# Patient Record
Sex: Female | Born: 1935 | Race: White | Hispanic: No | Marital: Married | Smoking: Former smoker
Health system: Southern US, Community
[De-identification: ages and names within clinical notes are randomized; demographics above are authoritative.]

## PROBLEM LIST (undated history)

## (undated) DIAGNOSIS — I340 Nonrheumatic mitral (valve) insufficiency: Secondary | ICD-10-CM

## (undated) DIAGNOSIS — I350 Nonrheumatic aortic (valve) stenosis: Secondary | ICD-10-CM

## (undated) DIAGNOSIS — N6019 Diffuse cystic mastopathy of unspecified breast: Secondary | ICD-10-CM

## (undated) DIAGNOSIS — I6529 Occlusion and stenosis of unspecified carotid artery: Secondary | ICD-10-CM

## (undated) DIAGNOSIS — I447 Left bundle-branch block, unspecified: Secondary | ICD-10-CM

## (undated) DIAGNOSIS — I495 Sick sinus syndrome: Secondary | ICD-10-CM

## (undated) DIAGNOSIS — I10 Essential (primary) hypertension: Secondary | ICD-10-CM

## (undated) DIAGNOSIS — I2510 Atherosclerotic heart disease of native coronary artery without angina pectoris: Secondary | ICD-10-CM

## (undated) DIAGNOSIS — E785 Hyperlipidemia, unspecified: Secondary | ICD-10-CM

## (undated) DIAGNOSIS — I509 Heart failure, unspecified: Secondary | ICD-10-CM

## (undated) DIAGNOSIS — I714 Abdominal aortic aneurysm, without rupture, unspecified: Secondary | ICD-10-CM

## (undated) DIAGNOSIS — I4891 Unspecified atrial fibrillation: Secondary | ICD-10-CM

## (undated) HISTORY — DX: Abdominal aortic aneurysm, without rupture, unspecified: I71.40

## (undated) HISTORY — DX: Nonrheumatic mitral (valve) insufficiency: I34.0

## (undated) HISTORY — PX: APPENDECTOMY: SHX54

## (undated) HISTORY — DX: Essential (primary) hypertension: I10

## (undated) HISTORY — PX: CHOLECYSTECTOMY: SHX55

## (undated) HISTORY — DX: Sick sinus syndrome: I49.5

## (undated) HISTORY — PX: OTHER SURGICAL HISTORY: SHX169

## (undated) HISTORY — PX: HIP FRACTURE SURGERY: SHX118

## (undated) HISTORY — DX: Occlusion and stenosis of unspecified carotid artery: I65.29

## (undated) HISTORY — DX: Abdominal aortic aneurysm, without rupture: I71.4

## (undated) HISTORY — DX: Unspecified atrial fibrillation: I48.91

## (undated) HISTORY — DX: Diffuse cystic mastopathy of unspecified breast: N60.19

## (undated) HISTORY — DX: Nonrheumatic aortic (valve) stenosis: I35.0

## (undated) HISTORY — DX: Hyperlipidemia, unspecified: E78.5

## (undated) HISTORY — DX: Heart failure, unspecified: I50.9

## (undated) HISTORY — DX: Atherosclerotic heart disease of native coronary artery without angina pectoris: I25.10

## (undated) HISTORY — PX: BREAST LUMPECTOMY: SHX2

## (undated) HISTORY — DX: Left bundle-branch block, unspecified: I44.7

---

## 1995-03-02 HISTORY — PX: ABDOMINAL HYSTERECTOMY: SHX81

## 1996-03-01 HISTORY — PX: TOTAL VAGINAL HYSTERECTOMY: SHX2548

## 2004-03-03 ENCOUNTER — Ambulatory Visit: Payer: Self-pay | Admitting: General Surgery

## 2004-06-30 ENCOUNTER — Ambulatory Visit: Payer: Self-pay | Admitting: Gastroenterology

## 2004-10-08 ENCOUNTER — Ambulatory Visit: Payer: Self-pay | Admitting: Internal Medicine

## 2005-03-01 HISTORY — PX: EYE SURGERY: SHX253

## 2005-03-08 ENCOUNTER — Ambulatory Visit: Payer: Self-pay | Admitting: General Surgery

## 2005-11-03 ENCOUNTER — Ambulatory Visit: Payer: Self-pay | Admitting: Ophthalmology

## 2005-11-09 ENCOUNTER — Ambulatory Visit: Payer: Self-pay | Admitting: Internal Medicine

## 2006-01-05 ENCOUNTER — Ambulatory Visit: Payer: Self-pay | Admitting: Ophthalmology

## 2006-03-01 HISTORY — PX: CARDIAC CATHETERIZATION: SHX172

## 2006-03-01 HISTORY — PX: INSERT / REPLACE / REMOVE PACEMAKER: SUR710

## 2006-03-01 HISTORY — PX: CORONARY ARTERY BYPASS GRAFT: SHX141

## 2006-04-20 ENCOUNTER — Ambulatory Visit: Payer: Self-pay | Admitting: General Surgery

## 2006-09-14 ENCOUNTER — Ambulatory Visit: Payer: Self-pay | Admitting: Internal Medicine

## 2007-01-17 ENCOUNTER — Ambulatory Visit: Payer: Self-pay | Admitting: Internal Medicine

## 2007-01-21 ENCOUNTER — Other Ambulatory Visit: Payer: Self-pay

## 2007-01-22 ENCOUNTER — Inpatient Hospital Stay: Payer: Self-pay | Admitting: Internal Medicine

## 2007-01-23 ENCOUNTER — Other Ambulatory Visit: Payer: Self-pay

## 2007-01-27 HISTORY — PX: OTHER SURGICAL HISTORY: SHX169

## 2007-04-19 ENCOUNTER — Ambulatory Visit: Payer: Self-pay | Admitting: General Surgery

## 2008-02-06 ENCOUNTER — Ambulatory Visit: Payer: Self-pay | Admitting: Internal Medicine

## 2008-02-19 ENCOUNTER — Ambulatory Visit: Payer: Self-pay | Admitting: Internal Medicine

## 2008-03-15 ENCOUNTER — Ambulatory Visit: Payer: Self-pay | Admitting: General Surgery

## 2008-03-21 ENCOUNTER — Ambulatory Visit: Payer: Self-pay | Admitting: General Surgery

## 2008-04-22 ENCOUNTER — Ambulatory Visit: Payer: Self-pay | Admitting: General Surgery

## 2008-09-03 ENCOUNTER — Ambulatory Visit: Payer: Self-pay | Admitting: General Surgery

## 2008-11-07 ENCOUNTER — Ambulatory Visit: Payer: Self-pay | Admitting: Cardiovascular Disease

## 2009-04-14 ENCOUNTER — Ambulatory Visit: Payer: Self-pay | Admitting: General Surgery

## 2009-08-09 ENCOUNTER — Observation Stay: Payer: Self-pay | Admitting: Specialist

## 2009-08-12 ENCOUNTER — Inpatient Hospital Stay: Payer: Self-pay | Admitting: Specialist

## 2009-10-23 ENCOUNTER — Ambulatory Visit: Payer: Self-pay | Admitting: Gastroenterology

## 2009-10-29 LAB — PATHOLOGY REPORT

## 2010-02-11 ENCOUNTER — Ambulatory Visit: Payer: Self-pay | Admitting: Gastroenterology

## 2010-04-06 ENCOUNTER — Ambulatory Visit: Payer: Self-pay | Admitting: General Surgery

## 2010-04-15 ENCOUNTER — Ambulatory Visit: Payer: Self-pay | Admitting: General Surgery

## 2010-08-14 ENCOUNTER — Emergency Department: Payer: Self-pay | Admitting: Emergency Medicine

## 2011-04-12 ENCOUNTER — Ambulatory Visit: Payer: Self-pay | Admitting: General Surgery

## 2011-04-20 ENCOUNTER — Ambulatory Visit: Payer: Self-pay | Admitting: General Surgery

## 2011-07-25 ENCOUNTER — Observation Stay: Payer: Self-pay | Admitting: Internal Medicine

## 2011-07-25 LAB — COMPREHENSIVE METABOLIC PANEL
Albumin: 3.6 g/dL (ref 3.4–5.0)
Alkaline Phosphatase: 66 U/L (ref 50–136)
Anion Gap: 9 (ref 7–16)
BUN: 31 mg/dL — ABNORMAL HIGH (ref 7–18)
Bilirubin,Total: 0.6 mg/dL (ref 0.2–1.0)
Calcium, Total: 8.4 mg/dL — ABNORMAL LOW (ref 8.5–10.1)
Chloride: 109 mmol/L — ABNORMAL HIGH (ref 98–107)
EGFR (African American): 36 — ABNORMAL LOW
EGFR (Non-African Amer.): 31 — ABNORMAL LOW
Osmolality: 284 (ref 275–301)
Potassium: 4 mmol/L (ref 3.5–5.1)
SGPT (ALT): 31 U/L
Sodium: 139 mmol/L (ref 136–145)
Total Protein: 7.7 g/dL (ref 6.4–8.2)

## 2011-07-25 LAB — URINALYSIS, COMPLETE
Bacteria: NONE SEEN
Bilirubin,UR: NEGATIVE
Hyaline Cast: 14
Nitrite: NEGATIVE
RBC,UR: 3 /HPF (ref 0–5)

## 2011-07-25 LAB — PROTIME-INR
INR: 1.9
Prothrombin Time: 22.3 secs — ABNORMAL HIGH (ref 11.5–14.7)

## 2011-07-25 LAB — CBC
HCT: 40.7 % (ref 35.0–47.0)
HGB: 13.6 g/dL (ref 12.0–16.0)
MCH: 31.7 pg (ref 26.0–34.0)
MCHC: 33.4 g/dL (ref 32.0–36.0)
RBC: 4.28 10*6/uL (ref 3.80–5.20)

## 2011-07-25 LAB — CK TOTAL AND CKMB (NOT AT ARMC)
CK, Total: 48 U/L (ref 21–215)
CK-MB: 1.5 ng/mL (ref 0.5–3.6)

## 2011-07-26 LAB — CBC WITH DIFFERENTIAL/PLATELET
Eosinophil #: 0.2 10*3/uL (ref 0.0–0.7)
Eosinophil %: 3.6 %
HCT: 35.5 % (ref 35.0–47.0)
HGB: 11.7 g/dL — ABNORMAL LOW (ref 12.0–16.0)
Lymphocyte #: 0.9 10*3/uL — ABNORMAL LOW (ref 1.0–3.6)
MCH: 31.5 pg (ref 26.0–34.0)
Monocyte #: 0.7 x10 3/mm (ref 0.2–0.9)
Monocyte %: 16.1 %
Neutrophil #: 2.5 10*3/uL (ref 1.4–6.5)
WBC: 4.2 10*3/uL (ref 3.6–11.0)

## 2011-07-26 LAB — BASIC METABOLIC PANEL
Anion Gap: 9 (ref 7–16)
BUN: 23 mg/dL — ABNORMAL HIGH (ref 7–18)
Calcium, Total: 8.3 mg/dL — ABNORMAL LOW (ref 8.5–10.1)
Chloride: 107 mmol/L (ref 98–107)
Co2: 22 mmol/L (ref 21–32)
Creatinine: 1.15 mg/dL (ref 0.60–1.30)
EGFR (Non-African Amer.): 45 — ABNORMAL LOW
Glucose: 123 mg/dL — ABNORMAL HIGH (ref 65–99)
Osmolality: 281 (ref 275–301)
Potassium: 4 mmol/L (ref 3.5–5.1)
Sodium: 138 mmol/L (ref 136–145)

## 2011-07-26 LAB — CLOSTRIDIUM DIFFICILE BY PCR

## 2011-07-26 LAB — MAGNESIUM: Magnesium: 1.6 mg/dL — ABNORMAL LOW

## 2011-07-26 LAB — OCCULT BLOOD X 1 CARD TO LAB, STOOL: Occult Blood, Feces: POSITIVE

## 2011-07-27 LAB — BASIC METABOLIC PANEL
Anion Gap: 9 (ref 7–16)
BUN: 14 mg/dL (ref 7–18)
Calcium, Total: 8.2 mg/dL — ABNORMAL LOW (ref 8.5–10.1)
Co2: 21 mmol/L (ref 21–32)
Creatinine: 1.12 mg/dL (ref 0.60–1.30)
Sodium: 141 mmol/L (ref 136–145)

## 2011-07-27 LAB — CBC WITH DIFFERENTIAL/PLATELET
Eosinophil #: 0.2 10*3/uL (ref 0.0–0.7)
Eosinophil %: 4 %
Lymphocyte #: 1.3 10*3/uL (ref 1.0–3.6)
Lymphocyte %: 24.5 %
MCH: 31.4 pg (ref 26.0–34.0)
Monocyte #: 0.9 x10 3/mm (ref 0.2–0.9)
Monocyte %: 16.5 %
Neutrophil #: 2.9 10*3/uL (ref 1.4–6.5)
Platelet: 182 10*3/uL (ref 150–440)
RBC: 3.67 10*6/uL — ABNORMAL LOW (ref 3.80–5.20)
RDW: 13.7 % (ref 11.5–14.5)
WBC: 5.3 10*3/uL (ref 3.6–11.0)

## 2011-07-27 LAB — PROTIME-INR
INR: 2.1
Prothrombin Time: 23.4 secs — ABNORMAL HIGH (ref 11.5–14.7)

## 2011-07-28 LAB — STOOL CULTURE

## 2011-08-27 ENCOUNTER — Encounter: Payer: Self-pay | Admitting: Cardiovascular Disease

## 2011-08-31 ENCOUNTER — Encounter: Payer: Self-pay | Admitting: Cardiovascular Disease

## 2011-08-31 ENCOUNTER — Ambulatory Visit (INDEPENDENT_AMBULATORY_CARE_PROVIDER_SITE_OTHER): Payer: Medicare Other | Admitting: Cardiovascular Disease

## 2011-08-31 VITALS — BP 110/58 | HR 78 | Ht 64.0 in | Wt 105.0 lb

## 2011-08-31 DIAGNOSIS — E785 Hyperlipidemia, unspecified: Secondary | ICD-10-CM | POA: Insufficient documentation

## 2011-08-31 DIAGNOSIS — I714 Abdominal aortic aneurysm, without rupture: Secondary | ICD-10-CM | POA: Insufficient documentation

## 2011-08-31 DIAGNOSIS — I509 Heart failure, unspecified: Secondary | ICD-10-CM | POA: Insufficient documentation

## 2011-08-31 DIAGNOSIS — I2510 Atherosclerotic heart disease of native coronary artery without angina pectoris: Secondary | ICD-10-CM

## 2011-08-31 DIAGNOSIS — I340 Nonrheumatic mitral (valve) insufficiency: Secondary | ICD-10-CM

## 2011-08-31 DIAGNOSIS — Z950 Presence of cardiac pacemaker: Secondary | ICD-10-CM

## 2011-08-31 DIAGNOSIS — R0600 Dyspnea, unspecified: Secondary | ICD-10-CM

## 2011-08-31 DIAGNOSIS — R0989 Other specified symptoms and signs involving the circulatory and respiratory systems: Secondary | ICD-10-CM | POA: Insufficient documentation

## 2011-08-31 DIAGNOSIS — I4891 Unspecified atrial fibrillation: Secondary | ICD-10-CM | POA: Insufficient documentation

## 2011-08-31 NOTE — Assessment & Plan Note (Signed)
Continue long-term anticoagulation with warfarin with a target INR between 2 and 3. I will enroll her in our anticoagulation clinic. We can consider switching her to one of the newer agents if desired.

## 2011-08-31 NOTE — Patient Instructions (Addendum)
Your physician has requested that you have an echocardiogram. Echocardiography is a painless test that uses sound waves to create images of your heart. It provides your doctor with information about the size and shape of your heart and how well your heart's chambers and valves are working. This procedure takes approximately one hour. There are no restrictions for this procedure.  Your physician has requested that you have a carotid duplex. This test is an ultrasound of the carotid arteries in your neck. It looks at blood flow through these arteries that supply the brain with blood. Allow one hour for this exam. There are no restrictions or special instructions.  Enroll in anticoagulation clinic.   Dr. Graciela Husbands to establish pacemanker care.   Dr. Darrick Huntsman or Dr. Dan Humphreys to establish primary care.   Follow up after tests.

## 2011-08-31 NOTE — Assessment & Plan Note (Signed)
I will obtain carotid  duplex ultrasound.

## 2011-08-31 NOTE — Progress Notes (Signed)
HPI  Robin [name] is a pleasant 78 year old female who is here today to establish cardiovascular care with me. I took care of her in 2008 when she presented with non-ST elevation myocardial infarction, heart failure with severely reduced LV systolic function as well as atrial fibrillation. Cardiac catheterization at that time showed severe three-vessel coronary artery disease. She underwent CABG at [facility] by Dr. Earlene Plater as well as Maze procedure. She had heart block that required dual-chamber pacemaker placement. She has done well since then with medical therapy. Her ejection fraction gradually improved and most recently was 40% in 2011. Patient is requesting to transfer all her care from [facility] to [place].  She has been relatively stable from a cardiac standpoint. However, she has been having recurrent issues with diarrhea and was hospitalized at [facility] in [place] for that reason. Basic workup for C. difficile and stool cultures were unremarkable. The patient's diarrhea is currently better. On average twice a day and the stool is more formed than before. She is using Imodium as needed.  Allergies  Allergen Reactions  . Amoxicillin   . Codeine     Nausea/vomiting/diarrhea     Current Outpatient Prescriptions on File Prior to Visit  Medication Sig Dispense Refill  . atorvastatin (LIPITOR) 40 MG tablet Take 40 mg by mouth daily.      . clonazePAM (KLONOPIN) 0.5 MG tablet Take 0.5 mg by mouth daily.      . Cyanocobalamin (VITAMIN B-12 SL) Place 2 sprays under the tongue 3 (three) times daily.      . digoxin (LANOXIN) 0.125 MG tablet Take 125 mcg by mouth daily.      Marland Kitchen FLUoxetine (PROZAC) 40 MG capsule Take 40 mg by mouth daily.      . metoprolol succinate (TOPROL-XL) 50 MG 24 hr tablet Take 50 mg by mouth daily. Take with or immediately following a meal.      . ramipril (ALTACE) 5 MG tablet Take 5 mg by mouth daily.      Marland Kitchen warfarin (COUMADIN) 1 MG tablet Takes 1/2 tablet daily as directed.          Past Medical History  Diagnosis Date  . Hypertension   . Abdominal aortic aneurysm     4.2 cm followed by Dr. Evette Cristal  . Hyperlipidemia   . CAD (coronary artery disease)     Severe 3 vessel CAD with severe ischemic cardiomyopathy in 12/2006. s/p CABG at [facility] by Dr. Earlene Plater: LIMA to LAD, SVG to OM1, SVG to [facility] and SVG to PDA. cardiac cath 06/11 : patent grafts.   . CHF (congestive heart failure)     w/ejection fraction 10% to 15%  . Atrial fibrillation   . Left bundle branch block   . Sick sinus syndrome     s/p dual chamber pacemaker 01/2007 post CABG and Maze procedure.   . Mitral regurgitation     Moderate to severe by echo but mild by intraoperative TEE in 2008     Past Surgical History  Procedure Date  . Insert / replace / remove pacemaker 2008    @ [facility]  . Bilateral maze procedure   . Coronary bypass graft stenosis 01/27/07    @ [facility];Dr. Earlene Plater  . Total vaginal hysterectomy 1998  . Cholecystectomy   . Appendectomy   . Breast lumpectomy     left  . Cardiac catheterization 2008    LAD 80% MID, LCX 100%MID, RCA 70%  . Coronary artery bypass graft 2008  not a redo candidate due to calcified aorta     History reviewed. No pertinent family history.   History   Social History  . Marital Status: Married    Spouse Name: N/A    Number of Children: N/A  . Years of Education: N/A   Occupational History  . Not on file.   Social History Main Topics  . Smoking status: Never Smoker   . Smokeless tobacco: Not on file  . Alcohol Use: No  . Drug Use: No  . Sexually Active:    Other Topics Concern  . Not on file   Social History Narrative  . No narrative on file     ROS Constitutional: Negative for fever, chills, diaphoresis.  HENT: Negative for hearing loss, nosebleeds, congestion, sore throat, facial swelling, drooling, trouble swallowing, neck pain, voice change, sinus pressure and tinnitus.  Eyes: Negative for photophobia, pain, discharge and visual  disturbance.  Respiratory: Negative for apnea, cough and wheezing.  Cardiovascular: Negative for thyroid some palpitations and leg swelling.  Gastrointestinal: Negative for nausea, vomiting, abdominal pain, constipation, blood in stool and abdominal distention.  Genitourinary: Negative for dysuria, urgency, frequency, hematuria and decreased urine volume.  Musculoskeletal: Negative for myalgias, back pain, joint swelling, arthralgias and gait problem.  Skin: Negative for color change, pallor, rash and wound.  Neurological: Negative for dizziness, tremors, seizures, syncope, speech difficulty, weakness, light-headedness, numbness and headaches.  Psychiatric/Behavioral: Negative for suicidal ideas, hallucinations, behavioral problems and agitation. The patient is not nervous/anxious.     PHYSICAL EXAM   BP 110/58  Pulse 78  Ht 5\' 4"  (1.626 m)  Wt 105 lb (47.628 kg)  BMI 18.02 kg/m2 Constitutional: She is oriented to person, place, and time. She appears well-developed and well-nourished. No distress.  HENT: No nasal discharge.  Head: Normocephalic and atraumatic.  Eyes: Pupils are equal and round. Right eye exhibits no discharge. Left eye exhibits no discharge.  Neck: Normal range of motion. Neck supple. No JVD present. No thyromegaly present. There is right carotid bruit Cardiovascular: Normal rate, regular rhythm, normal heart sounds. Exam reveals no gallop and no friction rub. There is a 3/6 holosystolic murmur at the apex and left sternal border.  Pulmonary/Chest: Effort normal and breath sounds normal. No stridor. No respiratory distress. She has no wheezes. She has no rales. She exhibits no tenderness.  Abdominal: Soft. Bowel sounds are normal. She exhibits no distension. There is no tenderness. There is no rebound and no guarding.  Musculoskeletal: Normal range of motion. She exhibits no edema and no tenderness.  Neurological: She is alert and oriented to person, place, and time.  Coordination normal.  Skin: Skin is warm and dry. No rash noted. She is not diaphoretic. No erythema. No pallor.  Psychiatric: She has a normal mood and affect. Her behavior is normal. Judgment and thought content normal.     EKG: Electronic atrial pacemaker   -Left bundle branch block.    ASSESSMENT AND PLAN

## 2011-08-31 NOTE — Assessment & Plan Note (Signed)
Recheck with a transthoracic echocardiogram.

## 2011-08-31 NOTE — Assessment & Plan Note (Signed)
I will have her establish in our EP clinic for interrogation and followup.

## 2011-08-31 NOTE — Assessment & Plan Note (Signed)
She appears to be euvolemic. Currently [place] Heart Association class II .Continue treatment with Toprol and Ramipril.  Recheck echocardiogram. I will consider stopping  digoxin if ejection fraction is greater than 35%.

## 2011-08-31 NOTE — Assessment & Plan Note (Signed)
Continue treatment with atorvastatin. Target LDL is less than 70. 

## 2011-08-31 NOTE — Assessment & Plan Note (Signed)
She is currently not having any symptoms suggestive of angina. Most recent cardiac catheterization in 2011 showed patent grafts. Continue medical therapy.   

## 2011-09-09 ENCOUNTER — Encounter (INDEPENDENT_AMBULATORY_CARE_PROVIDER_SITE_OTHER): Payer: Medicare Other

## 2011-09-09 DIAGNOSIS — I6529 Occlusion and stenosis of unspecified carotid artery: Secondary | ICD-10-CM

## 2011-09-09 DIAGNOSIS — R0989 Other specified symptoms and signs involving the circulatory and respiratory systems: Secondary | ICD-10-CM

## 2011-09-20 ENCOUNTER — Other Ambulatory Visit (INDEPENDENT_AMBULATORY_CARE_PROVIDER_SITE_OTHER): Payer: Medicare Other

## 2011-09-20 ENCOUNTER — Other Ambulatory Visit: Payer: Self-pay

## 2011-09-20 DIAGNOSIS — R0989 Other specified symptoms and signs involving the circulatory and respiratory systems: Secondary | ICD-10-CM

## 2011-09-20 DIAGNOSIS — R0600 Dyspnea, unspecified: Secondary | ICD-10-CM

## 2011-09-23 ENCOUNTER — Ambulatory Visit (INDEPENDENT_AMBULATORY_CARE_PROVIDER_SITE_OTHER): Payer: Medicare Other | Admitting: Cardiovascular Disease

## 2011-09-23 ENCOUNTER — Encounter: Payer: Self-pay | Admitting: Cardiovascular Disease

## 2011-09-23 VITALS — BP 122/60 | HR 72 | Ht 64.0 in | Wt 165.5 lb

## 2011-09-23 DIAGNOSIS — I509 Heart failure, unspecified: Secondary | ICD-10-CM

## 2011-09-23 DIAGNOSIS — R0989 Other specified symptoms and signs involving the circulatory and respiratory systems: Secondary | ICD-10-CM

## 2011-09-23 DIAGNOSIS — I4891 Unspecified atrial fibrillation: Secondary | ICD-10-CM

## 2011-09-23 DIAGNOSIS — I350 Nonrheumatic aortic (valve) stenosis: Secondary | ICD-10-CM | POA: Insufficient documentation

## 2011-09-23 DIAGNOSIS — I340 Nonrheumatic mitral (valve) insufficiency: Secondary | ICD-10-CM

## 2011-09-23 DIAGNOSIS — I2510 Atherosclerotic heart disease of native coronary artery without angina pectoris: Secondary | ICD-10-CM

## 2011-09-23 DIAGNOSIS — I059 Rheumatic mitral valve disease, unspecified: Secondary | ICD-10-CM

## 2011-09-23 NOTE — Assessment & Plan Note (Signed)
Moderate by echo. She also has aortic valve disease. Repeat echocardiogram in one to 2 years.

## 2011-09-23 NOTE — Progress Notes (Signed)
HPI  [name] is a pleasant 78 year old female who is here today for a followup visit. In 2008  she presented with non-ST elevation myocardial infarction, heart failure with severely reduced LV systolic function (EF10-15%) as well as atrial fibrillation. Cardiac catheterization at that time showed severe three-vessel coronary artery disease. She underwent CABG at [facility] by Dr. Earlene Plater as well as Maze procedure. She had heart block that required dual-chamber pacemaker placement. She has done well since then with medical therapy. Her ejection fraction gradually improved. In 2011 and it was 40%. Echo from this month showed improvement to 50-55%. She has been relatively stable from a cardiac standpoint. However, she has been having recurrent issues with diarrhea and was hospitalized at [facility] in [place] for that reason. Basic workup for C. difficile and stool cultures were unremarkable. The patient's diarrhea is significantly better now.  She was noted to have right carotid bruit. Carotid duplex ultrasound showed moderate ICA stenosis bilaterally.   Allergies  Allergen Reactions  . Amoxicillin   . Codeine     Nausea/vomiting/diarrhea     Current Outpatient Prescriptions on File Prior to Visit  Medication Sig Dispense Refill  . atorvastatin (LIPITOR) 40 MG tablet Take 40 mg by mouth daily.      . clonazePAM (KLONOPIN) 0.5 MG tablet Take 0.5 mg by mouth daily as needed.       . Cyanocobalamin (VITAMIN B-12 SL) Place 2 sprays under the tongue 3 (three) times daily.      Marland Kitchen FLUoxetine (PROZAC) 40 MG capsule Take 40 mg by mouth daily.      . metoprolol succinate (TOPROL-XL) 50 MG 24 hr tablet Take 50 mg by mouth daily. Take with or immediately following a meal.      . Multiple Vitamins-Minerals (CENTRUM SILVER PO) Take by mouth daily.      . Probiotic Product (ALIGN PO) Take by mouth daily.      . ramipril (ALTACE) 5 MG tablet Take 5 mg by mouth daily.      Marland Kitchen warfarin (COUMADIN) 1 MG tablet Takes 1/2  tablet daily as directed.      . warfarin (COUMADIN) 3 MG tablet Take 3 mg by mouth daily. As directed.         Past Medical History  Diagnosis Date  . Hypertension   . Abdominal aortic aneurysm     4.2 cm followed by Dr. Evette Cristal  . Hyperlipidemia   . CAD (coronary artery disease)     Severe 3 vessel CAD with severe ischemic cardiomyopathy in 12/2006. s/p CABG at [facility] by Dr. Earlene Plater: LIMA to LAD, SVG to OM1, SVG to [facility] and SVG to PDA. cardiac cath 06/11 : patent grafts.   . CHF (congestive heart failure)     w/ejection fraction 10% to 15%  . Atrial fibrillation   . Left bundle branch block   . Sick sinus syndrome     s/p dual chamber pacemaker 01/2007 post CABG and Maze procedure.   . Mitral regurgitation     Moderate to severe by echo but mild by intraoperative TEE in 2008  . Aortic stenosis     Mild to moderate with mild to moderate AI.      Past Surgical History  Procedure Date  . Insert / replace / remove pacemaker 2008    @ [facility]  . Bilateral maze procedure   . Coronary bypass graft stenosis 01/27/07    @ [facility];Dr. Earlene Plater  . Total vaginal hysterectomy 1998  .  Cholecystectomy   . Appendectomy   . Breast lumpectomy     left  . Cardiac catheterization 2008    LAD 80% MID, LCX 100%MID, RCA 70%  . Coronary artery bypass graft 2008    not a redo candidate due to calcified aorta     History reviewed. No pertinent family history.   History   Social History  . Marital Status: Married    Spouse Name: N/A    Number of Children: N/A  . Years of Education: N/A   Occupational History  . Not on file.   Social History Main Topics  . Smoking status: Former Smoker -- 0.2 packs/day for 50 years    Types: Cigarettes    Quit date: 08/01/2006  . Smokeless tobacco: Not on file  . Alcohol Use: 0.6 oz/week    1 Glasses of wine per week     occasional  . Drug Use: No  . Sexually Active:    Other Topics Concern  . Not on file   Social History Narrative  . No narrative on  file     PHYSICAL EXAM   BP 122/60  Pulse 72  Ht 5\' 4"  (1.626 m)  Wt 165 lb 8 oz (75.07 kg)  BMI 28.41 kg/m2  Constitutional: She is oriented to person, place, and time. She appears well-developed and well-nourished. No distress.  HENT: No nasal discharge.  Head: Normocephalic and atraumatic.  Eyes: Pupils are equal and round. Right eye exhibits no discharge. Left eye exhibits no discharge.  Neck: Normal range of motion. Neck supple. No JVD present. No thyromegaly present. There is right carotid bruit  Cardiovascular: Normal rate, regular rhythm, normal heart sounds. Exam reveals no gallop and no friction rub. There is a 3/6 holosystolic murmur at the apex and left sternal border. There is a 2/6 systolic ejection murmur at the aortic area. Pulmonary/Chest: Effort normal and breath sounds normal. No stridor. No respiratory distress. She has no wheezes. She has no rales. She exhibits no tenderness.  Abdominal: Soft. Bowel sounds are normal. She exhibits no distension. There is no tenderness. There is no rebound and no guarding.  Musculoskeletal: Normal range of motion. She exhibits no edema and no tenderness.  Neurological: She is alert and oriented to person, place, and time. Coordination normal.  Skin: Skin is warm and dry. No rash noted. She is not diaphoretic. No erythema. No pallor.  Psychiatric: She has a normal mood and affect. Her behavior is normal. Judgment and thought content normal.     ASSESSMENT AND PLAN

## 2011-09-23 NOTE — Assessment & Plan Note (Signed)
Moderate bilateral carotid stenosis. She will need a followup carotid duplex ultrasound in July of 2014.

## 2011-09-23 NOTE — Patient Instructions (Addendum)
Your echo showed improvement in heart function ( EF 50-55%).  Stop taking Digoxin.  Follow up in 6 months.

## 2011-09-23 NOTE — Assessment & Plan Note (Signed)
Continue long-term anticoagulation with warfarin with a target INR between 2 and 3. I discussed with her novel agents of anticoagulation. She will check with her insurance and see the cost of Xarelto.

## 2011-09-23 NOTE — Assessment & Plan Note (Signed)
The patient overall had significant improvement in her symptoms. Currently she is in [place] Heart Association class II. Ejection fraction improved to 50-55%. Thus, I recommend stopping digoxin. Continue treatment with Toprol and Ramipril.

## 2011-09-23 NOTE — Assessment & Plan Note (Signed)
She is currently not having any symptoms suggestive of angina. Most recent cardiac catheterization in 2011 showed patent grafts. Continue medical therapy.

## 2011-09-24 ENCOUNTER — Ambulatory Visit: Payer: Medicare Other | Admitting: Cardiovascular Disease

## 2011-09-29 ENCOUNTER — Ambulatory Visit (INDEPENDENT_AMBULATORY_CARE_PROVIDER_SITE_OTHER): Payer: Medicare Other

## 2011-09-29 DIAGNOSIS — Z7901 Long term (current) use of anticoagulants: Secondary | ICD-10-CM | POA: Insufficient documentation

## 2011-09-29 DIAGNOSIS — I4891 Unspecified atrial fibrillation: Secondary | ICD-10-CM

## 2011-10-08 ENCOUNTER — Ambulatory Visit (INDEPENDENT_AMBULATORY_CARE_PROVIDER_SITE_OTHER): Payer: Medicare Other | Admitting: Internal Medicine

## 2011-10-08 ENCOUNTER — Encounter: Payer: Self-pay | Admitting: Internal Medicine

## 2011-10-08 VITALS — BP 134/70 | HR 69 | Temp 98.7°F | Ht 62.0 in | Wt 102.5 lb

## 2011-10-08 DIAGNOSIS — R197 Diarrhea, unspecified: Secondary | ICD-10-CM

## 2011-10-08 DIAGNOSIS — I2510 Atherosclerotic heart disease of native coronary artery without angina pectoris: Secondary | ICD-10-CM

## 2011-10-08 DIAGNOSIS — I4891 Unspecified atrial fibrillation: Secondary | ICD-10-CM

## 2011-10-08 DIAGNOSIS — E785 Hyperlipidemia, unspecified: Secondary | ICD-10-CM

## 2011-10-08 NOTE — Progress Notes (Signed)
Subjective:    Patient ID: Robin [name], female    DOB: 1929-02-26, 78 y.o.   MRN: [id number]  HPI 78 year old female with history of CAD status post CABG, hypertension, hyperlipidemia, atrial fibrillation, congestive heart failure presents to establish care. She reports that she is generally feeling well. Her only concern today is occasional episodes of watery, nonbloody diarrhea, mostly in the morning. She reports she has been evaluated by GI medicine for this and has had a recent colonoscopy which was normal. She denies any fever, chills, abdominal pain. She denies any nausea or vomiting. She was concerned because her sister recently passed away after having issues with her: Including diarrhea.  In regards to her history of CAD, hypertension, hyperlipidemia, congestive heart failure, atrial fibrillation, she reports she has been doing very well. She denies any chest pain, shortness of breath, palpitations. She reports full compliance with her medications. She reports full compliance with Coumadin. Recent Coumadin level was slightly elevated she is scheduled to have repeat level next week. She denies any recent bleeding or bruising, with the exception of one event in which she fell and sustained a bruise on her right elbow.  Outpatient Encounter Prescriptions as of 10/08/2011  Medication Sig Dispense Refill  . atorvastatin (LIPITOR) 40 MG tablet Take 40 mg by mouth daily.      . clonazePAM (KLONOPIN) 0.5 MG tablet Take 0.5 mg by mouth daily as needed.       . Cyanocobalamin (VITAMIN B-12 SL) Place 2 sprays under the tongue 3 (three) times daily.      Marland Kitchen FLUoxetine (PROZAC) 40 MG capsule Take 40 mg by mouth daily.      . metoprolol succinate (TOPROL-XL) 50 MG 24 hr tablet Take 50 mg by mouth daily. Take with or immediately following a meal.      . Multiple Vitamins-Minerals (CENTRUM SILVER PO) Take by mouth daily.      . Probiotic Product (ALIGN PO) Take by mouth daily.      . ramipril (ALTACE) 5  MG tablet Take 5 mg by mouth daily.      Marland Kitchen warfarin (COUMADIN) 3 MG tablet Take 3 mg by mouth daily. As directed.      . warfarin (COUMADIN) 1 MG tablet Takes 1/2 tablet daily as directed.       BP 134/70  Pulse 69  Temp 98.7 F (37.1 C) (Oral)  Ht 5\' 2"  (1.575 m)  Wt 102 lb 8 oz (46.494 kg)  BMI 18.75 kg/m2  SpO2 98%  Review of Systems  Constitutional: Negative for fever, chills, appetite change, fatigue and unexpected weight change.  HENT: Negative for ear pain, congestion, sore throat, trouble swallowing, neck pain, voice change and sinus pressure.   Eyes: Negative for visual disturbance.  Respiratory: Negative for cough, shortness of breath, wheezing and stridor.   Cardiovascular: Negative for chest pain, palpitations and leg swelling.  Gastrointestinal: Positive for diarrhea. Negative for nausea, vomiting, abdominal pain, constipation, blood in stool, abdominal distention and anal bleeding.  Genitourinary: Negative for dysuria and flank pain.  Musculoskeletal: Negative for myalgias, arthralgias and gait problem.  Skin: Negative for color change and rash.  Neurological: Negative for dizziness and headaches.  Hematological: Negative for adenopathy. Does not bruise/bleed easily.  Psychiatric/Behavioral: Negative for suicidal ideas, disturbed wake/sleep cycle and dysphoric mood. The patient is not nervous/anxious.        Objective:   Physical Exam  Constitutional: She is oriented to person, place, and time. She appears well-developed and well-nourished.  No distress.  HENT:  Head: Normocephalic and atraumatic.  Right Ear: External ear normal.  Left Ear: External ear normal.  Nose: Nose normal.  Mouth/Throat: Oropharynx is clear and moist. No oropharyngeal exudate.  Eyes: Conjunctivae are normal. Pupils are equal, round, and reactive to light. Right eye exhibits no discharge. Left eye exhibits no discharge. No scleral icterus.  Neck: Normal range of motion. Neck supple. No  tracheal deviation present. No thyromegaly present.  Cardiovascular: Normal rate, regular rhythm, normal heart sounds and intact distal pulses.  Exam reveals no gallop and no friction rub.   No murmur heard. Pulmonary/Chest: Effort normal and breath sounds normal. No respiratory distress. She has no wheezes. She has no rales. She exhibits no tenderness.  Abdominal: Soft. Bowel sounds are normal. She exhibits no distension. There is no tenderness.  Musculoskeletal: Normal range of motion. She exhibits no edema and no tenderness.  Lymphadenopathy:    She has no cervical adenopathy.  Neurological: She is alert and oriented to person, place, and time. No cranial nerve deficit. She exhibits normal muscle tone. Coordination normal.  Skin: Skin is warm and dry. No rash noted. She is not diaphoretic. No erythema. No pallor.  Psychiatric: She has a normal mood and affect. Her behavior is normal. Judgment and thought content normal.          Assessment & Plan:

## 2011-10-08 NOTE — Assessment & Plan Note (Signed)
Ongoing for several months. Patient reports extensive workup including colonoscopy which she reports was normal. Will obtain records on this. She will continue to use Imodium as needed in the mornings to help limit diarrhea. She will call if symptoms are worsening. Will also obtain recent lab work including CMP and lipase. She will followup in 2 months or sooner as needed.

## 2011-10-08 NOTE — Assessment & Plan Note (Signed)
Asymptomatic. Will continue medical therapy.

## 2011-10-08 NOTE — Assessment & Plan Note (Signed)
Rate well controlled on current medications. Anticoagulated with coumadin. Goal INR 2-3. Will continue to monitor.

## 2011-10-08 NOTE — Assessment & Plan Note (Signed)
Will get records on recent lipids performed by PCP. Goal LDL <70. Continue atorvastatin.

## 2011-10-13 ENCOUNTER — Ambulatory Visit (INDEPENDENT_AMBULATORY_CARE_PROVIDER_SITE_OTHER): Payer: Medicare Other

## 2011-10-13 DIAGNOSIS — Z7901 Long term (current) use of anticoagulants: Secondary | ICD-10-CM

## 2011-10-13 DIAGNOSIS — I4891 Unspecified atrial fibrillation: Secondary | ICD-10-CM

## 2011-10-14 ENCOUNTER — Ambulatory Visit: Payer: Self-pay | Admitting: General Surgery

## 2011-10-19 ENCOUNTER — Encounter: Payer: Self-pay | Admitting: Internal Medicine

## 2011-10-19 ENCOUNTER — Ambulatory Visit (INDEPENDENT_AMBULATORY_CARE_PROVIDER_SITE_OTHER): Payer: Medicare Other | Admitting: Internal Medicine

## 2011-10-19 ENCOUNTER — Encounter: Payer: Self-pay | Admitting: *Deleted

## 2011-10-19 VITALS — BP 100/54 | HR 76 | Ht 62.0 in | Wt 104.5 lb

## 2011-10-19 DIAGNOSIS — I359 Nonrheumatic aortic valve disorder, unspecified: Secondary | ICD-10-CM

## 2011-10-19 DIAGNOSIS — Z950 Presence of cardiac pacemaker: Secondary | ICD-10-CM

## 2011-10-19 DIAGNOSIS — I495 Sick sinus syndrome: Secondary | ICD-10-CM

## 2011-10-19 DIAGNOSIS — I350 Nonrheumatic aortic (valve) stenosis: Secondary | ICD-10-CM

## 2011-10-19 DIAGNOSIS — I4891 Unspecified atrial fibrillation: Secondary | ICD-10-CM

## 2011-10-19 LAB — PACEMAKER DEVICE OBSERVATION
ATRIAL PACING PM: 100
DEVICE MODEL PM: 574689
RV LEAD AMPLITUDE: 10.1 mv

## 2011-10-19 NOTE — Assessment & Plan Note (Signed)
No atrial fibrillation is detected on her device. She is status post MAZE and thus may have had a left atrial appendectomy. If that is in fact the case, consideration could be given to discontinuing her anticoagulation

## 2011-10-19 NOTE — Assessment & Plan Note (Signed)
The patient's device was interrogated.  The information was reviewed. No changes were made in the programming.    

## 2011-10-19 NOTE — Progress Notes (Signed)
HPI  Robin [name] is a 78 y.o. female Seen to establish pacemaker followup. It was implanted 2008.  At that time, she suffered a non-STEMI complicated by heart failure and atrial fibrillation. She has severe three-vessel disease and underwent CABG and Maze at [facility] and since then has done increasingly well with most recent assessment of ejection fraction July 2013 having gone up to 50-55%.  She has a history of atrial fibrillation and is on Coumadin. Alternative anticoagulants were discussed with the co-pay  were very high She has known heart murmur.  She Exercise tolerance is outstanding. She exercises daily. HShe has no complaints of chest pain or shortness of breath.        Past Medical History  Diagnosis Date  . Hypertension   . Abdominal aortic aneurysm     4.2 cm followed by Dr. Evette Cristal  . Hyperlipidemia   . CAD (coronary artery disease)     Severe 3 vessel CAD with severe ischemic cardiomyopathy in 12/2006. s/p CABG at [facility] by Dr. Earlene Plater: LIMA to LAD, SVG to OM1, SVG to [facility] and SVG to PDA. cardiac cath 06/11 : patent grafts.   . CHF (congestive heart failure)     w/ejection fraction 10% to 15%  . Atrial fibrillation   . Left bundle branch block   . Sick sinus syndrome     s/p dual chamber pacemaker 01/2007 post CABG and Maze procedure.   . Mitral regurgitation     Moderate to severe by echo but mild by intraoperative TEE in 2008  . Aortic stenosis     Mild to moderate with mild to moderate AI.     Past Surgical History  Procedure Date  . Insert / replace / remove pacemaker 2008    @ [facility]  . Bilateral maze procedure   . Coronary bypass graft stenosis 01/27/07    @ [facility];Dr. Earlene Plater  . Total vaginal hysterectomy 1998  . Cholecystectomy   . Appendectomy   . Breast lumpectomy     left  . Cardiac catheterization 2008    LAD 80% MID, LCX 100%MID, RCA 70%  . Coronary artery bypass graft 2008    not a redo candidate due to calcified aorta    Current Outpatient  Prescriptions  Medication Sig Dispense Refill  . atorvastatin (LIPITOR) 40 MG tablet Take 40 mg by mouth daily.      . clonazePAM (KLONOPIN) 0.5 MG tablet Take 0.5 mg by mouth daily as needed.       . Cyanocobalamin (VITAMIN B-12 SL) Place 2 sprays under the tongue 3 (three) times daily.      Marland Kitchen FLUoxetine (PROZAC) 40 MG capsule Take 40 mg by mouth daily.      . metoprolol succinate (TOPROL-XL) 50 MG 24 hr tablet Take 50 mg by mouth daily. Take with or immediately following a meal.      . Multiple Vitamins-Minerals (CENTRUM SILVER PO) Take by mouth daily.      . Probiotic Product (ALIGN PO) Take by mouth daily.      . ramipril (ALTACE) 5 MG tablet Take 5 mg by mouth daily.      Marland Kitchen warfarin (COUMADIN) 3 MG tablet Take 3 mg on Monday, Tuesday, Thursday, Friday & Saturday and Sunday. Take 1 1/2 mg on Wednesday.      Marland Kitchen DISCONTD: warfarin (COUMADIN) 1 MG tablet Takes 1/2 tablet daily as directed.       Family history noncontributory Social history she is married she has 5 children  one of whom it does not communicate with Review of systems negative apart from was listed above  Allergies  Allergen Reactions  . Amoxicillin   . Codeine     Nausea/vomiting/diarrhea    Review of Systems negative except from HPI and PMH  Physical Exam BP 100/54  Pulse 76  Ht 5\' 2"  (1.575 m)  Wt 104 lb 8 oz (47.401 kg)  BMI 19.11 kg/m2 Well developed and well nourished in no acute distress HENT normal E scleral and icterus clear Neck Supple JVP flat; carotids parvus et tardus Clear to ausculation Regular rate and rhythm, 2/6 systolic murmur radiating up the right sternal single S2  Soft with active bowel sounds No clubbing cyanosis none Edema Alert and oriented, grossly normal motor and sensory function Skin Warm and Dry    Assessment and  Plan A

## 2011-10-19 NOTE — Patient Instructions (Addendum)
Your physician wants you to follow-up in:  6 months. You will receive a reminder letter in the mail two months in advance. If you don't receive a letter, please call our office to schedule the follow-up appointment.   

## 2011-10-19 NOTE — Assessment & Plan Note (Signed)
Moderate aortic stenosis

## 2011-10-19 NOTE — Assessment & Plan Note (Signed)
She has significant sinus node dysfunction and in fact from for incompetence and inadequate rate response by histogram data. However, given the absence of symptoms, no reprogramming will be undertaken.

## 2011-10-22 ENCOUNTER — Other Ambulatory Visit: Payer: Self-pay | Admitting: *Deleted

## 2011-10-22 MED ORDER — FLUOXETINE HCL 40 MG PO CAPS: 40.0000 mg | ORAL_CAPSULE | Freq: Every day | ORAL | Status: DC

## 2011-10-24 ENCOUNTER — Observation Stay: Payer: Self-pay | Admitting: Internal Medicine

## 2011-10-24 DIAGNOSIS — I4891 Unspecified atrial fibrillation: Secondary | ICD-10-CM

## 2011-10-24 DIAGNOSIS — I059 Rheumatic mitral valve disease, unspecified: Secondary | ICD-10-CM

## 2011-10-24 LAB — CBC
HCT: 37 % (ref 35.0–47.0)
HGB: 12.5 g/dL (ref 12.0–16.0)
MCV: 93 fL (ref 80–100)
Platelet: 207 10*3/uL (ref 150–440)
RDW: 14.3 % (ref 11.5–14.5)

## 2011-10-24 LAB — COMPREHENSIVE METABOLIC PANEL
Anion Gap: 8 (ref 7–16)
BUN: 21 mg/dL — ABNORMAL HIGH (ref 7–18)
Chloride: 106 mmol/L (ref 98–107)
Co2: 22 mmol/L (ref 21–32)
EGFR (African American): 51 — ABNORMAL LOW
EGFR (Non-African Amer.): 44 — ABNORMAL LOW
Potassium: 4.5 mmol/L (ref 3.5–5.1)
SGOT(AST): 54 U/L — ABNORMAL HIGH (ref 15–37)
SGPT (ALT): 46 U/L (ref 12–78)

## 2011-10-24 LAB — CK TOTAL AND CKMB (NOT AT ARMC)
CK, Total: 61 U/L (ref 21–215)
CK-MB: 0.7 ng/mL (ref 0.5–3.6)

## 2011-10-24 LAB — URINALYSIS, COMPLETE
Bacteria: NONE SEEN
Bilirubin,UR: NEGATIVE
Glucose,UR: NEGATIVE mg/dL (ref 0–75)
Leukocyte Esterase: NEGATIVE
Nitrite: NEGATIVE
WBC UR: NONE SEEN /HPF (ref 0–5)

## 2011-10-24 LAB — TSH: Thyroid Stimulating Horm: 1.63 u[IU]/mL

## 2011-10-24 LAB — PRO B NATRIURETIC PEPTIDE: B-Type Natriuretic Peptide: 2088 pg/mL — ABNORMAL HIGH (ref 0–450)

## 2011-10-24 LAB — TROPONIN I: Troponin-I: <0.02 ng/mL

## 2011-10-25 ENCOUNTER — Telehealth: Payer: Self-pay | Admitting: Internal Medicine

## 2011-10-25 LAB — COMPREHENSIVE METABOLIC PANEL
Alkaline Phosphatase: 93 U/L (ref 50–136)
Anion Gap: 8 (ref 7–16)
Bilirubin,Total: 0.9 mg/dL (ref 0.2–1.0)
Chloride: 105 mmol/L (ref 98–107)
Co2: 24 mmol/L (ref 21–32)
Creatinine: 1.24 mg/dL (ref 0.60–1.30)
EGFR (African American): 47 — ABNORMAL LOW
EGFR (Non-African Amer.): 41 — ABNORMAL LOW
Osmolality: 277 (ref 275–301)
Potassium: 4.3 mmol/L (ref 3.5–5.1)
Sodium: 137 mmol/L (ref 136–145)
Total Protein: 7.3 g/dL (ref 6.4–8.2)

## 2011-10-25 LAB — CBC WITH DIFFERENTIAL/PLATELET
Basophil %: 1.4 %
Eosinophil #: 0.3 10*3/uL (ref 0.0–0.7)
Eosinophil %: 3.9 %
HGB: 12.3 g/dL (ref 12.0–16.0)
Lymphocyte #: 1.5 10*3/uL (ref 1.0–3.6)
MCH: 30.5 pg (ref 26.0–34.0)
MCHC: 32.8 g/dL (ref 32.0–36.0)
MCV: 93 fL (ref 80–100)
Monocyte #: 0.9 x10 3/mm (ref 0.2–0.9)
Monocyte %: 12.8 %
Neutrophil #: 4.1 10*3/uL (ref 1.4–6.5)
Neutrophil %: 60.4 %
Platelet: 211 10*3/uL (ref 150–440)
RBC: 4.04 10*6/uL (ref 3.80–5.20)
WBC: 6.8 10*3/uL (ref 3.6–11.0)

## 2011-10-25 LAB — DIGOXIN LEVEL: Digoxin: 0.4 ng/mL

## 2011-10-25 LAB — CK TOTAL AND CKMB (NOT AT ARMC)
CK, Total: 46 U/L (ref 21–215)
CK-MB: <0.5 ng/mL — ABNORMAL LOW (ref 0.5–3.6)

## 2011-10-25 NOTE — Telephone Encounter (Signed)
Spoke with patient via telephone, she stated that she just came home today, her being in the hospital and what she went through was a bit scary but she will see [place] on Thursday.

## 2011-10-25 NOTE — Telephone Encounter (Signed)
Hospital follow up 2-3 days ,heart failure 8.29.13 @ 11:15.

## 2011-10-28 ENCOUNTER — Ambulatory Visit (INDEPENDENT_AMBULATORY_CARE_PROVIDER_SITE_OTHER): Payer: Medicare Other | Admitting: Internal Medicine

## 2011-10-28 ENCOUNTER — Encounter: Payer: Self-pay | Admitting: Internal Medicine

## 2011-10-28 VITALS — BP 130/70 | HR 59 | Temp 98.2°F | Ht 62.0 in | Wt 102.2 lb

## 2011-10-28 DIAGNOSIS — I4891 Unspecified atrial fibrillation: Secondary | ICD-10-CM

## 2011-10-28 DIAGNOSIS — F411 Generalized anxiety disorder: Secondary | ICD-10-CM

## 2011-10-28 DIAGNOSIS — I509 Heart failure, unspecified: Secondary | ICD-10-CM

## 2011-10-28 DIAGNOSIS — E785 Hyperlipidemia, unspecified: Secondary | ICD-10-CM

## 2011-10-28 DIAGNOSIS — I10 Essential (primary) hypertension: Secondary | ICD-10-CM

## 2011-10-28 DIAGNOSIS — F419 Anxiety disorder, unspecified: Secondary | ICD-10-CM

## 2011-10-28 DIAGNOSIS — Z7901 Long term (current) use of anticoagulants: Secondary | ICD-10-CM

## 2011-10-28 MED ORDER — RAMIPRIL 5 MG PO TABS: 5.0000 mg | ORAL_TABLET | Freq: Every day | ORAL | Status: DC

## 2011-10-28 MED ORDER — ATORVASTATIN CALCIUM 40 MG PO TABS: 40.0000 mg | ORAL_TABLET | Freq: Every day | ORAL | Status: DC

## 2011-10-28 MED ORDER — CLONAZEPAM 0.5 MG PO TABS: 0.5000 mg | ORAL_TABLET | Freq: Three times a day (TID) | ORAL | Status: DC | PRN

## 2011-10-28 MED ORDER — FLUOXETINE HCL 40 MG PO CAPS: 40.0000 mg | ORAL_CAPSULE | Freq: Every day | ORAL | Status: DC

## 2011-10-28 MED ORDER — WARFARIN SODIUM 3 MG PO TABS: ORAL_TABLET | ORAL | Status: DC

## 2011-10-28 NOTE — Progress Notes (Signed)
Subjective:    Patient ID: Robin [name], female    DOB: 10-23-1928, 78 y.o.   MRN: [id number]  HPI 78 year old female with history of atrial fibrillation, hypertension, hyperlipidemia presents for followup after recent hospitalization for age of fibrillation with RVR. She reports that last weekend she was sitting down to eat breakfast when she developed a rapid heart rate. Her husband checked her pulse and it was near 130. She notes palpitations but no chest pain during that time. She called EMS and was admitted at [facility] for further evaluation. She reports that symptoms improved with IV medication. Her dose of metoprolol was increased to 100 mg daily. She also had cardiac echo performed during her admission but is unsure of results. Since discharge home, she reports she has been feeling well. She would like to get back into her usual routine of walking several times per day. She denies any palpitations, shortness of breath, chest pain.  Outpatient Encounter Prescriptions as of 10/28/2011  Medication Sig Dispense Refill  . atorvastatin (LIPITOR) 40 MG tablet Take 1 tablet (40 mg total) by mouth daily.  90 tablet  3  . clonazePAM (KLONOPIN) 0.5 MG tablet Take 1 tablet (0.5 mg total) by mouth 3 (three) times daily as needed.  90 tablet  3  . Cyanocobalamin (VITAMIN B-12 SL) Place 2 sprays under the tongue 3 (three) times daily.      Marland Kitchen FLUoxetine (PROZAC) 40 MG capsule Take 1 capsule (40 mg total) by mouth daily.  90 capsule  3  . metoprolol succinate (TOPROL-XL) 100 MG 24 hr tablet Take 100 mg by mouth daily. Take with or immediately following a meal.      . Multiple Vitamins-Minerals (CENTRUM SILVER PO) Take by mouth daily.      . Probiotic Product (ALIGN PO) Take by mouth daily.      . ramipril (ALTACE) 5 MG tablet Take 1 tablet (5 mg total) by mouth daily.  90 tablet  3  . warfarin (COUMADIN) 3 MG tablet Take 3 mg on Monday, Tuesday, Thursday, Friday & Saturday and Sunday. Take 1 1/2  mg on Wednesday.  90 tablet  3   BP 130/70  Pulse 59  Temp 98.2 F (36.8 C) (Oral)  Ht 5\' 2"  (1.575 m)  Wt 102 lb 4 oz (46.38 kg)  BMI 18.70 kg/m2  SpO2 97%  Review of Systems  Constitutional: Negative for fever, chills, appetite change, fatigue and unexpected weight change.  HENT: Negative for ear pain, congestion, sore throat, trouble swallowing, neck pain, voice change and sinus pressure.   Eyes: Negative for visual disturbance.  Respiratory: Negative for cough, shortness of breath, wheezing and stridor.   Cardiovascular: Positive for palpitations. Negative for chest pain and leg swelling.  Gastrointestinal: Negative for nausea, vomiting, abdominal pain, diarrhea, constipation, blood in stool, abdominal distention and anal bleeding.  Genitourinary: Negative for dysuria and flank pain.  Musculoskeletal: Negative for myalgias, arthralgias and gait problem.  Skin: Negative for color change and rash.  Neurological: Negative for dizziness and headaches.  Hematological: Negative for adenopathy. Does not bruise/bleed easily.  Psychiatric/Behavioral: Negative for suicidal ideas, disturbed wake/sleep cycle and dysphoric mood. The patient is not nervous/anxious.        Objective:   Physical Exam  Constitutional: She is oriented to person, place, and time. She appears well-developed and well-nourished. No distress.  HENT:  Head: Normocephalic and atraumatic.  Right Ear: External ear normal.  Left Ear: External ear normal.  Nose: Nose normal.  Mouth/Throat: Oropharynx is clear and moist. No oropharyngeal exudate.  Eyes: Conjunctivae are normal. Pupils are equal, round, and reactive to light. Right eye exhibits no discharge. Left eye exhibits no discharge. No scleral icterus.  Neck: Normal range of motion. Neck supple. No tracheal deviation present. No thyromegaly present.  Cardiovascular: Normal rate, regular rhythm and intact distal pulses.  Exam reveals no gallop and no friction rub.     Murmur heard. Pulmonary/Chest: Effort normal and breath sounds normal. No respiratory distress. She has no wheezes. She has no rales. She exhibits no tenderness.  Musculoskeletal: Normal range of motion. She exhibits no edema and no tenderness.  Lymphadenopathy:    She has no cervical adenopathy.  Neurological: She is alert and oriented to person, place, and time. No cranial nerve deficit. She exhibits normal muscle tone. Coordination normal.  Skin: Skin is warm and dry. No rash noted. She is not diaphoretic. No erythema. No pallor.  Psychiatric: She has a normal mood and affect. Her behavior is normal. Judgment and thought content normal.          Assessment & Plan:

## 2011-10-28 NOTE — Assessment & Plan Note (Signed)
Will request recent echo performed during hospitalization. Symptomatically doing well. Continue Toprol and ramipril. Follow up 3 months.

## 2011-10-28 NOTE — Assessment & Plan Note (Signed)
Will get records from recent hospitalization as to lipids. Goal LDL less than 70. Continue atorvastatin.

## 2011-10-28 NOTE — Assessment & Plan Note (Addendum)
Greater than spent with pt and husband reviewing recent hospitalization and management of atrial fibrillation.  Recent episode of hospitalization for atrial fibrillation and RVR. Metoprolol was increased to 100 mg. Patient denies any recurrent symptoms. She does note some hypotension with systolic blood pressures between 80 and 90. Question if she may be able to reduce dose back to 50 mg daily. Will defer to her cardiologist. Followup with cardiology scheduled for next week. Continue anticoagulation with goal of INR 2-3.

## 2011-10-29 ENCOUNTER — Telehealth: Payer: Self-pay | Admitting: Internal Medicine

## 2011-10-29 NOTE — Telephone Encounter (Signed)
I spoke with Dr. Kirke Corin. He would prefer for Ms. Erber to say on the current dose of her Toprol, 100mg  daily, to try to prevent recurrent episodes of afib.

## 2011-10-29 NOTE — Telephone Encounter (Signed)
Patient advised as instructed via telephone. 

## 2011-11-02 ENCOUNTER — Encounter (INDEPENDENT_AMBULATORY_CARE_PROVIDER_SITE_OTHER): Payer: Medicare Other

## 2011-11-02 ENCOUNTER — Encounter: Payer: Self-pay | Admitting: Cardiovascular Disease

## 2011-11-02 ENCOUNTER — Ambulatory Visit (INDEPENDENT_AMBULATORY_CARE_PROVIDER_SITE_OTHER): Payer: Medicare Other | Admitting: Cardiovascular Disease

## 2011-11-02 VITALS — BP 130/72 | HR 60 | Ht 62.0 in | Wt 104.0 lb

## 2011-11-02 DIAGNOSIS — I4891 Unspecified atrial fibrillation: Secondary | ICD-10-CM

## 2011-11-02 DIAGNOSIS — I350 Nonrheumatic aortic (valve) stenosis: Secondary | ICD-10-CM

## 2011-11-02 DIAGNOSIS — I6529 Occlusion and stenosis of unspecified carotid artery: Secondary | ICD-10-CM | POA: Insufficient documentation

## 2011-11-02 DIAGNOSIS — Z950 Presence of cardiac pacemaker: Secondary | ICD-10-CM

## 2011-11-02 DIAGNOSIS — Z7901 Long term (current) use of anticoagulants: Secondary | ICD-10-CM

## 2011-11-02 DIAGNOSIS — I509 Heart failure, unspecified: Secondary | ICD-10-CM

## 2011-11-02 DIAGNOSIS — I359 Nonrheumatic aortic valve disorder, unspecified: Secondary | ICD-10-CM

## 2011-11-02 DIAGNOSIS — I2510 Atherosclerotic heart disease of native coronary artery without angina pectoris: Secondary | ICD-10-CM

## 2011-11-02 DIAGNOSIS — R0989 Other specified symptoms and signs involving the circulatory and respiratory systems: Secondary | ICD-10-CM

## 2011-11-02 NOTE — Progress Notes (Signed)
HPI  [name] is a pleasant 78 year old female who is here today for a followup visit. In 2008 she presented with non-ST elevation myocardial infarction, heart failure with severely reduced LV systolic function (EF10-15%) as well as atrial fibrillation. Cardiac catheterization at that time showed severe three-vessel coronary artery disease. She underwent CABG at [facility] by Dr. Earlene Plater as well as Maze procedure. She had heart block that required dual-chamber pacemaker placement. She has done well since then with medical therapy. Her ejection fraction gradually improved. In 2011 and it was 40%. Echo from this month showed improvement to 50-55% and most recently 45% at [facility] after atrial fibrillation. She was hospitalized briefly last week at [facility] after she presented with atrial fibrillation with rapid ventricular response. She converted to normal sinus rhythm with diltiazem. The dose of Toprol was increased from 50 to100 mg once daily. This was her first episode of atrial fibrillation since her surgery as far as I am aware. She has not had any further tachycardia since increasing the dose of Toprol. She reports having no energy since that time. She is not able to do as many activities that she used to before this event. She initially reported low blood pressure readings after increasing the dose. However, this has not been an issue in the last few days.  Allergies  Allergen Reactions  . Amoxicillin   . Codeine     Nausea/vomiting/diarrhea     Current Outpatient Prescriptions on File Prior to Visit  Medication Sig Dispense Refill  . atorvastatin (LIPITOR) 40 MG tablet Take 1 tablet (40 mg total) by mouth daily.  90 tablet  3  . clonazePAM (KLONOPIN) 0.5 MG tablet Take 1 tablet (0.5 mg total) by mouth 3 (three) times daily as needed.  90 tablet  3  . Cyanocobalamin (VITAMIN B-12 SL) Place 2 sprays under the tongue 3 (three) times daily.      Marland Kitchen FLUoxetine (PROZAC) 40 MG capsule Take 1 capsule (40 mg  total) by mouth daily.  90 capsule  3  . metoprolol succinate (TOPROL-XL) 100 MG 24 hr tablet Take 100 mg by mouth daily. Take with or immediately following a meal.      . Multiple Vitamins-Minerals (CENTRUM SILVER PO) Take by mouth daily.      . Probiotic Product (ALIGN PO) Take by mouth daily.      . ramipril (ALTACE) 5 MG tablet Take 1 tablet (5 mg total) by mouth daily.  90 tablet  3  . warfarin (COUMADIN) 3 MG tablet Take 3 mg on Monday, Tuesday, Thursday, Friday & Saturday and Sunday. Take 1 1/2 mg on Wednesday.  90 tablet  3     Past Medical History  Diagnosis Date  . Hypertension   . Abdominal aortic aneurysm     4.2 cm followed by Dr. Evette Cristal  . Hyperlipidemia   . CAD (coronary artery disease)     Severe 3 vessel CAD with severe ischemic cardiomyopathy in 12/2006. s/p CABG at [facility] by Dr. Earlene Plater: LIMA to LAD, SVG to OM1, SVG to [facility] and SVG to PDA. cardiac cath 06/11 : patent grafts.   . CHF (congestive heart failure)     w/ejection fraction 10% to 15%  . Atrial fibrillation   . Left bundle branch block   . Sick sinus syndrome     s/p dual chamber pacemaker 01/2007 post CABG and Maze procedure.   . Mitral regurgitation     Moderate to severe by echo but mild by  intraoperative TEE in 2008  . Aortic stenosis     Mild to moderate with mild to moderate AI.      Past Surgical History  Procedure Date  . Insert / replace / remove pacemaker 2008    @ [facility]  . Bilateral maze procedure   . Coronary bypass graft stenosis 01/27/07    @ [facility];Dr. Earlene Plater  . Total vaginal hysterectomy 1998  . Cholecystectomy   . Appendectomy   . Breast lumpectomy     left  . Cardiac catheterization 2008    LAD 80% MID, LCX 100%MID, RCA 70%  . Coronary artery bypass graft 2008    not a redo candidate due to calcified aorta     Family History  Problem Relation Age of Onset  . Heart disease Mother      History   Social History  . Marital Status: Married    Spouse Name: N/A    Number of  Children: N/A  . Years of Education: N/A   Occupational History  . Not on file.   Social History Main Topics  . Smoking status: Former Smoker -- 0.2 packs/day for 50 years    Types: Cigarettes    Quit date: 08/01/2006  . Smokeless tobacco: Not on file  . Alcohol Use: 0.6 oz/week    1 Glasses of wine per week     occasional  . Drug Use: No  . Sexually Active:    Other Topics Concern  . Not on file   Social History Narrative   Lives with husband and youngest daughter. Has 5 children.Work - retired in 2000 from [employer]     PHYSICAL EXAM   BP 130/72  Pulse 60  Ht 5\' 2"  (1.575 m)  Wt 104 lb (47.174 kg)  BMI 19.02 kg/m2 Constitutional: She is oriented to person, place, and time. She appears well-developed and well-nourished. No distress.  HENT: No nasal discharge.  Head: Normocephalic and atraumatic.  Eyes: Pupils are equal and round. Right eye exhibits no discharge. Left eye exhibits no discharge.  Neck: Normal range of motion. Neck supple. No JVD present. No thyromegaly present. Right carotid bruit. Cardiovascular: Normal rate, regular rhythm, normal heart sounds. Exam reveals no gallop and no friction rub. 2/6 systolic ejection murmur at the aortic area. Pulmonary/Chest: Effort normal and breath sounds normal. No stridor. No respiratory distress. She has no wheezes. She has no rales. She exhibits no tenderness.  Abdominal: Soft. Bowel sounds are normal. She exhibits no distension. There is no tenderness. There is no rebound and no guarding.  Musculoskeletal: Normal range of motion. She exhibits no edema and no tenderness.  Neurological: She is alert and oriented to person, place, and time. Coordination normal.  Skin: Skin is warm and dry. No rash noted. She is not diaphoretic. No erythema. No pallor.  Psychiatric: She has a normal mood and affect. Her behavior is normal. Judgment and thought content normal.     EKG: Electronic atrial pacemaker  -Left bundle  branch block.    ASSESSMENT AND PLAN

## 2011-11-02 NOTE — Patient Instructions (Addendum)
Continue same medications.  I will contact Dr. Graciela Husbands about the pacemaker and let you know.  Follow up in 3 months.

## 2011-11-04 NOTE — Assessment & Plan Note (Signed)
Currently she is in [place] Heart Association class II but she reports increased fatigue since increasing the dose of Toprol. Recent Ejection fraction was 50-55% but repeat echo at [facility] after the episode of atrial fibrillation showed an ejection fraction of 40-45%. Continue treatment with Toprol and Ramipril.

## 2011-11-04 NOTE — Assessment & Plan Note (Signed)
Continue to monitor moderate aortic stenosis

## 2011-11-04 NOTE — Assessment & Plan Note (Signed)
She had a recent episodes of atrial fibrillation with rapid ventricular response that converted quickly to normal sinus rhythm. We increased the dose of Toprol to 100 mg once daily. This dose will be continued. Continue long-term anticoagulation with warfarin.

## 2011-11-04 NOTE — Assessment & Plan Note (Signed)
She is currently not having any symptoms suggestive of angina. Most recent cardiac catheterization in 2011 showed patent grafts. Continue medical therapy.   

## 2011-11-04 NOTE — Assessment & Plan Note (Signed)
Since increasing the dose of Toprol to 100 mg once daily, the patient reports exercise intolerance as well as fatigue. I will check with Dr. Graciela Husbands to see if her pacemaker can be reprogrammed to ensure proper heart rate response activities.

## 2011-11-24 ENCOUNTER — Ambulatory Visit (INDEPENDENT_AMBULATORY_CARE_PROVIDER_SITE_OTHER): Payer: Medicare Other

## 2011-11-24 DIAGNOSIS — Z7901 Long term (current) use of anticoagulants: Secondary | ICD-10-CM

## 2011-11-24 DIAGNOSIS — I4891 Unspecified atrial fibrillation: Secondary | ICD-10-CM

## 2011-11-24 LAB — POCT INR: INR: 3

## 2011-12-08 ENCOUNTER — Telehealth: Payer: Self-pay | Admitting: Internal Medicine

## 2011-12-08 NOTE — Telephone Encounter (Signed)
Refill request for metoprolol er succinate 100 mg  Take 1 tablet by mouth every day

## 2011-12-09 MED ORDER — METOPROLOL SUCCINATE ER 100 MG PO TB24: 100.0000 mg | ORAL_TABLET | Freq: Every day | ORAL | Status: DC

## 2011-12-22 ENCOUNTER — Ambulatory Visit (INDEPENDENT_AMBULATORY_CARE_PROVIDER_SITE_OTHER): Payer: Medicare Other

## 2011-12-22 DIAGNOSIS — I4891 Unspecified atrial fibrillation: Secondary | ICD-10-CM

## 2011-12-22 DIAGNOSIS — Z7901 Long term (current) use of anticoagulants: Secondary | ICD-10-CM

## 2011-12-27 ENCOUNTER — Telehealth: Payer: Self-pay | Admitting: Internal Medicine

## 2011-12-27 NOTE — Telephone Encounter (Signed)
Caller: Versie/Patient; Patient Name: Robin [name]; PCP: Ronna Polio (Adults only); Best Callback Phone Number: [phone]; Reason for call: Other Pt has been having dizzy spells x the past week. Pt states they come and go. No fever. Pts appetite is down but she is drinking liquids. Pt has lost 2 sisters and 1 brother in -law in the past 5 months. Pt teary on the phone with the nurse. Pt states "its been rough". Pt is unsteady on her feet at times. Pt is hydrated: she drinks alot of liquids. Rn triaged per dizzyness and scheduled pt tomorrow at 10:15 with Dr. Darrick Huntsman

## 2011-12-28 ENCOUNTER — Encounter: Payer: Self-pay | Admitting: Internal Medicine

## 2011-12-28 ENCOUNTER — Ambulatory Visit (INDEPENDENT_AMBULATORY_CARE_PROVIDER_SITE_OTHER): Payer: Medicare Other | Admitting: Internal Medicine

## 2011-12-28 VITALS — BP 120/60 | HR 69 | Temp 97.7°F | Ht 62.5 in | Wt 101.5 lb

## 2011-12-28 DIAGNOSIS — R42 Dizziness and giddiness: Secondary | ICD-10-CM

## 2011-12-28 DIAGNOSIS — I739 Peripheral vascular disease, unspecified: Secondary | ICD-10-CM

## 2011-12-28 DIAGNOSIS — I951 Orthostatic hypotension: Secondary | ICD-10-CM | POA: Insufficient documentation

## 2011-12-28 NOTE — Progress Notes (Signed)
Patient ID: Robin [name], female   DOB: 1928-04-08, 78 y.o.   MRN: [id number]   Patient Active Problem List  Diagnosis  . Abdominal aortic aneurysm  . Atrial fibrillation  . Hyperlipidemia  . CAD (coronary artery disease)  . CHF (congestive heart failure)  . Mitral regurgitation  . Right carotid bruit  . PPM-[employer]  . Aortic stenosis  . Encounter for long-term (current) use of anticoagulants  . Diarrhea  . Sinoatrial node dysfunction  . Carotid artery occlusion  . Orthostasis  . Dizziness    Subjective:  CC:   Chief Complaint  Patient presents with  . Dizziness    HPI:   Robin Farrugia Witherbyis a 78 y.o. female who presents One week history of dizziness without true vertigo. Patient is accompanied by her daughter. She reports that for the last several days to weeks she has developed lightheadedness which occurs mostly in the evening when she gets out of bed to use the restroom but also occurs occasionally during the day. On several occasions she's had to sit down immediately to avoid falling. She has had no falls. She also notices it when she turns her head to the left she feels dizzy as well. 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 She has had recent emotional stressors as she lost her  sister and her mother-in-law last month..     Past Medical History  Diagnosis Date  . Hypertension   . Abdominal aortic aneurysm     4.2 cm followed by Dr. Evette Cristal  . Hyperlipidemia   . CAD (coronary artery disease)     Severe 3 vessel CAD with severe ischemic cardiomyopathy in 12/2006. s/p CABG at [facility] by Dr. Earlene Plater: LIMA to LAD, SVG to OM1, SVG to [facility] and SVG to PDA. cardiac cath 06/11 : patent grafts.   . CHF (congestive heart failure)     w/ejection  fraction 10% to 15%  . Atrial fibrillation   . Left bundle branch block   . Sick sinus syndrome     s/p dual chamber pacemaker 01/2007 post CABG and Maze procedure.   . Mitral regurgitation     Moderate to severe by echo but mild by intraoperative TEE in 2008  . Aortic stenosis     Mild to moderate with mild to moderate AI.   Marland Kitchen Carotid artery occlusion     Moderate bilateral with right carotid bruit    Past Surgical History  Procedure Date  . Insert / replace / remove pacemaker 2008    @ [facility]  . Bilateral maze procedure   . Coronary bypass graft stenosis 01/27/07    @ [facility];Dr. Earlene Plater  . Total vaginal hysterectomy 1998  . Cholecystectomy   . Appendectomy   . Breast lumpectomy     left  . Cardiac catheterization 2008    LAD 80% MID, LCX 100%MID, RCA 70%  . Coronary artery bypass graft 2008    not a redo candidate due to calcified aorta         The following portions of the patient's history were reviewed and updated as appropriate: Allergies, current medications, and problem list.    Review of Systems:   12 Pt  review of systems was negative except those addressed in the HPI,  History   Social History  . Marital Status: Married    Spouse Name: N/A    Number of Children: N/A  . Years of Education: N/A   Occupational History  . Not on file.   Social History Main Topics  . Smoking status: Former Smoker -- 0.2 packs/day for 50 years    Types: Cigarettes    Quit date: 08/01/2006  . Smokeless tobacco: Not on file  . Alcohol Use: 0.6 oz/week    1 Glasses of wine per week     occasional  . Drug Use: No  . Sexually Active:    Other Topics Concern  . Not on file   Social History Narrative   Lives with husband and youngest daughter. Has 5 children.Work - retired in 2000 from [employer]    Objective:  BP 120/60  Pulse 69  Temp 97.7 F (36.5 C) (Oral)  Ht 5' 2.5" (1.588 m)  Wt 101 lb 8 oz (46.04 kg)  BMI 18.27 kg/m2  SpO2 94%  General  appearance: alert, cooperative and appears stated age Ears: normal TM's and external ear canals both ears Throat: lips, mucosa, and tongue normal; teeth and gums normal Neck: no adenopathy, right sided bruit, supple, symmetrical, trachea midline and thyroid not enlarged, symmetric, no tenderness/mass/nodules Back: symmetric, no curvature. ROM normal. No CVA tenderness. Lungs: clear to auscultation bilaterally Heart: regular rate and rhythm, S1, S2 normal, no murmur, click, rub or gallop Abdomen: soft, non-tender; bowel sounds normal; no masses,  no organomegaly Pulses: 2+ and symmetric Skin: Skin color, texture, turgor normal. No rashes or lesions Lymph nodes: Cervical, supraclavicular, and axillary nodes normal. Neuro: grossly nonfocal,  Cerebellar function normal  Assessment and Plan:  Orthostasis She did have a drop in systolic pressure from 140 to 1:61 with upright position. Pulse remained 68 due to medications and pacemaker.  Given her history of peripheral peripheral vascular disease and decreased pulse on the left foot I am sending her for ABIs prior to recommending use of compression stockings to help manage the orthostasis.  Dizziness Multifactorial. She does have orthostasis by today's exam. Spent a total of 40 minutes with patient and daughter discussing  that even though her blood pressure with upright position is normotensive, her cardiac output is dropping due to use of beta blocker controlling her heart rate and concurrent aortic stenosis . Her dizziness with head turning to the left may be due to her peripheral vascular disease. She does have carotid stenosis with retrograde flow in the vertebral artery on the left noted by carotid Dopplers done at Dr.Arida's officein July.  I believe she'll need repeat carotid evaluation to determine if she has developed a critical blockage. Refer to [facility].   Updated Medication List Outpatient Encounter Prescriptions as of 12/28/2011    Medication Sig Dispense Refill  . atorvastatin (LIPITOR) 40 MG tablet Take 1 tablet (40 mg total) by mouth daily.  90 tablet  3  . clonazePAM (KLONOPIN) 0.5 MG tablet Take 1 tablet (0.5 mg total) by mouth 3 (three) times daily as needed.  90 tablet  3  . Cyanocobalamin (VITAMIN B-12 SL) Place 2 sprays under the tongue 3 (three) times daily.      Marland Kitchen FLUoxetine (PROZAC) 40 MG capsule Take 1 capsule (40 mg total) by mouth daily.  90 capsule  3  . metoprolol succinate (TOPROL-XL) 100 MG 24 hr tablet Take 1 tablet (100 mg total) by mouth daily. Take with or immediately following a meal.  30 tablet  6  . Multiple Vitamins-Minerals (CENTRUM SILVER PO) Take by mouth daily.      . Probiotic Product (ALIGN PO) Take by mouth daily.      . ramipril (ALTACE) 5 MG tablet Take 1 tablet (5 mg total) by mouth daily.  90 tablet  3  . warfarin (COUMADIN) 3 MG tablet Take 3 mg on Monday, Tuesday, Thursday, Friday & Saturday and Sunday. Take 1 1/2 mg on Wednesday.  90 tablet  3     Orders Placed This Encounter  Procedures  . Ambulatory referral to Home Health  . Ambulatory referral to Vascular Surgery    No Follow-up on file.

## 2011-12-28 NOTE — Patient Instructions (Addendum)
Your dizziness appears to be coming from two issues:  Drop in blood pressure with sudden standing  Decreased blood flow in the arteries of the neck when you turn your head  We are arranging a vascular evaluation of your legs prior to recommending use of compression stockings to help maintain your blood pressure  We are sendin  Home Health agency out to assess your bathroom to decrease your risk of falls.

## 2011-12-29 ENCOUNTER — Encounter: Payer: Self-pay | Admitting: Internal Medicine

## 2011-12-29 DIAGNOSIS — R42 Dizziness and giddiness: Secondary | ICD-10-CM | POA: Insufficient documentation

## 2011-12-29 NOTE — Assessment & Plan Note (Addendum)
She did have a drop in systolic pressure from 140 to 5:78 with upright position. Pulse remained 68 due to medications and pacemaker.  Given her history of peripheral peripheral vascular disease and decreased pulse on the left foot I am sending her for ABIs prior to recommending use of compression stockings to help manage the orthostasis.

## 2011-12-29 NOTE — Assessment & Plan Note (Signed)
Multifactorial. She does have orthostasis by today's exam.  I explained to her daughter that even know her blood pressure with upright position is normotensive, her cardiac output is dropping due to use of beta blocker controlling her heart rate and concurrent aortic stenosis . Her dizziness with head turning to the left may be due to her peripheral vascular disease. She does have carotid stenosis with retrograde flow in the vertebral artery on the left noted by carotid Dopplers done at Dr.Arida's office less than 6 months ago I believe she'll need repeat carotid evaluation. Determined she is to develop a critical blockage.

## 2012-01-06 ENCOUNTER — Telehealth: Payer: Self-pay | Admitting: Internal Medicine

## 2012-01-06 DIAGNOSIS — I6509 Occlusion and stenosis of unspecified vertebral artery: Secondary | ICD-10-CM

## 2012-01-06 NOTE — Telephone Encounter (Signed)
US of the carotids showed possible occlusion of the vertebral arteries. I would recommend setting up CT angiogram for further evaluation.  I will place this order. Pt will also need to have a follow up as soon as study complete.

## 2012-01-06 NOTE — Telephone Encounter (Signed)
Recent carotid dopplers showed possible occlusion of the vertebral arteries. I would like to order a CT

## 2012-01-06 NOTE — Telephone Encounter (Signed)
Patients spouse advised via telephone, he would like to go ahead with the referral.  He will wait for Erie Noe to call him with referral appt and to schedule a follow up with Dr. Dan Humphreys.  Dr. Dan Humphreys please place referral.

## 2012-01-06 NOTE — Telephone Encounter (Signed)
Erie Noe - Can you set this up?

## 2012-01-11 ENCOUNTER — Encounter: Payer: Self-pay | Admitting: Internal Medicine

## 2012-01-11 ENCOUNTER — Ambulatory Visit (INDEPENDENT_AMBULATORY_CARE_PROVIDER_SITE_OTHER): Payer: Medicare Other | Admitting: Internal Medicine

## 2012-01-11 VITALS — BP 118/68 | HR 60 | Temp 98.1°F | Ht 62.0 in | Wt 106.0 lb

## 2012-01-11 DIAGNOSIS — I10 Essential (primary) hypertension: Secondary | ICD-10-CM

## 2012-01-11 DIAGNOSIS — E785 Hyperlipidemia, unspecified: Secondary | ICD-10-CM

## 2012-01-11 DIAGNOSIS — D649 Anemia, unspecified: Secondary | ICD-10-CM

## 2012-01-11 DIAGNOSIS — R42 Dizziness and giddiness: Secondary | ICD-10-CM

## 2012-01-11 LAB — CBC WITH DIFFERENTIAL/PLATELET
Basophils Relative: 1.1 % (ref 0.0–3.0)
Hemoglobin: 12 g/dL (ref 12.0–15.0)
Lymphocytes Relative: 20.9 % (ref 12.0–46.0)
Monocytes Relative: 13.8 % — ABNORMAL HIGH (ref 3.0–12.0)
Neutro Abs: 3.2 10*3/uL (ref 1.4–7.7)
RBC: 3.95 Mil/uL (ref 3.87–5.11)

## 2012-01-11 LAB — LIPID PANEL
HDL: 65.8 mg/dL (ref >39.00)
LDL Cholesterol: 45 mg/dL (ref 0–99)
Total CHOL/HDL Ratio: 2
VLDL: 16.4 mg/dL (ref 0.0–40.0)

## 2012-01-11 LAB — COMPREHENSIVE METABOLIC PANEL
AST: 39 U/L — ABNORMAL HIGH (ref 0–37)
BUN: 20 mg/dL (ref 6–23)
CO2: 23 mEq/L (ref 19–32)
Calcium: 8.7 mg/dL (ref 8.4–10.5)
Chloride: 105 mEq/L (ref 96–112)
Creatinine, Ser: 1.1 mg/dL (ref 0.4–1.2)
GFR: 48.55 mL/min — ABNORMAL LOW (ref >60.00)

## 2012-01-11 NOTE — Assessment & Plan Note (Signed)
Symptoms have resolved. Patient was noted to have vertebral artery stenosis on carotid Doppler. We discussed getting a CT angiogram for further evaluation. She would first like to review the results of the Doppler with her vascular surgeon. She will call or return to clinic if symptoms of dizziness are recurrent. Otherwise, followup 3 months.

## 2012-01-11 NOTE — Progress Notes (Signed)
Subjective:    Patient ID: Robin [name], female    DOB: [birth year], 78 y.o.   MRN: [id number]  HPI 78 year old female with history of peripheral vascular disease, hypertension , atrial fibrillation presents for follow up. She reports that recent episode of dizziness have completely resolved. She notes that she was set up with home health because of episode of dizziness but she does not feel that she needs the services. She denies any new concerns today.followup. She reports she is generally feeling well.  Outpatient Encounter Prescriptions as of 01/11/2012  Medication Sig Dispense Refill  . atorvastatin (LIPITOR) 40 MG tablet Take 1 tablet (40 mg total) by mouth daily.  90 tablet  3  . clonazePAM (KLONOPIN) 0.5 MG tablet Take 1 tablet (0.5 mg total) by mouth 3 (three) times daily as needed.  90 tablet  3  . Cyanocobalamin (VITAMIN B-12 SL) Place 2 sprays under the tongue 3 (three) times daily.      Marland Kitchen FLUoxetine (PROZAC) 40 MG capsule Take 1 capsule (40 mg total) by mouth daily.  90 capsule  3  . metoprolol succinate (TOPROL-XL) 100 MG 24 hr tablet Take 1 tablet (100 mg total) by mouth daily. Take with or immediately following a meal.  30 tablet  6  . Multiple Vitamins-Minerals (CENTRUM SILVER PO) Take by mouth daily.      . Probiotic Product (ALIGN PO) Take by mouth daily.      . ramipril (ALTACE) 5 MG tablet Take 1 tablet (5 mg total) by mouth daily.  90 tablet  3  . warfarin (COUMADIN) 3 MG tablet Take 3 mg on Monday, Tuesday, Thursday, Friday & Saturday and Sunday. Take 1 1/2 mg on Wednesday.  90 tablet  3    Review of Systems  Constitutional: Negative for fever, chills, appetite change, fatigue and unexpected weight change.  HENT: Negative for ear pain, congestion, sore throat, trouble swallowing, neck pain, voice change and sinus pressure.   Eyes: Negative for visual disturbance.  Respiratory: Negative for cough, shortness of breath, wheezing and stridor.   Cardiovascular: Negative  for chest pain, palpitations and leg swelling.  Gastrointestinal: Negative for nausea, vomiting, abdominal pain, diarrhea, constipation, blood in stool, abdominal distention and anal bleeding.  Genitourinary: Negative for dysuria and flank pain.  Musculoskeletal: Negative for myalgias, arthralgias and gait problem.  Skin: Negative for color change and rash.  Neurological: Negative for dizziness and headaches.  Hematological: Negative for adenopathy. Does not bruise/bleed easily.  Psychiatric/Behavioral: Negative for suicidal ideas, sleep disturbance and dysphoric mood. The patient is not nervous/anxious.        Objective:   Physical Exam  Constitutional: She is oriented to person, place, and time. She appears well-developed and well-nourished. No distress.  HENT:  Head: Normocephalic and atraumatic.  Right Ear: External ear normal.  Left Ear: External ear normal.  Nose: Nose normal.  Mouth/Throat: Oropharynx is clear and moist. No oropharyngeal exudate.  Eyes: Conjunctivae normal are normal. Pupils are equal, round, and reactive to light. Right eye exhibits no discharge. Left eye exhibits no discharge. No scleral icterus.  Neck: Normal range of motion. Neck supple. Carotid bruit is present (right). No tracheal deviation present. No thyromegaly present.  Cardiovascular: Normal rate, regular rhythm and intact distal pulses.  Exam reveals no gallop and no friction rub.   Murmur heard. Pulmonary/Chest: Effort normal and breath sounds normal. No respiratory distress. She has no wheezes. She has no rales. She exhibits no tenderness.  Musculoskeletal: Normal range of motion.  She exhibits no edema and no tenderness.  Lymphadenopathy:    She has no cervical adenopathy.  Neurological: She is alert and oriented to person, place, and time. No cranial nerve deficit. She exhibits normal muscle tone. Coordination normal.  Skin: Skin is warm and dry. No rash noted. She is not diaphoretic. No erythema.  No pallor.  Psychiatric: She has a normal mood and affect. Her behavior is normal. Judgment and thought content normal.          Assessment & Plan:

## 2012-01-12 ENCOUNTER — Encounter: Payer: Self-pay | Admitting: *Deleted

## 2012-01-14 ENCOUNTER — Encounter: Payer: Self-pay | Admitting: Internal Medicine

## 2012-01-19 ENCOUNTER — Ambulatory Visit (INDEPENDENT_AMBULATORY_CARE_PROVIDER_SITE_OTHER): Payer: Medicare Other

## 2012-01-19 DIAGNOSIS — Z7901 Long term (current) use of anticoagulants: Secondary | ICD-10-CM

## 2012-01-19 DIAGNOSIS — I4891 Unspecified atrial fibrillation: Secondary | ICD-10-CM

## 2012-01-19 LAB — POCT INR: INR: 2.8

## 2012-02-01 ENCOUNTER — Encounter: Payer: Self-pay | Admitting: Cardiovascular Disease

## 2012-02-01 ENCOUNTER — Ambulatory Visit (INDEPENDENT_AMBULATORY_CARE_PROVIDER_SITE_OTHER): Payer: Medicare Other | Admitting: Cardiovascular Disease

## 2012-02-01 VITALS — BP 112/62 | HR 61 | Ht 62.0 in | Wt 101.8 lb

## 2012-02-01 DIAGNOSIS — R42 Dizziness and giddiness: Secondary | ICD-10-CM

## 2012-02-01 DIAGNOSIS — I4891 Unspecified atrial fibrillation: Secondary | ICD-10-CM

## 2012-02-01 DIAGNOSIS — I350 Nonrheumatic aortic (valve) stenosis: Secondary | ICD-10-CM

## 2012-02-01 DIAGNOSIS — I509 Heart failure, unspecified: Secondary | ICD-10-CM

## 2012-02-01 DIAGNOSIS — I2510 Atherosclerotic heart disease of native coronary artery without angina pectoris: Secondary | ICD-10-CM

## 2012-02-01 DIAGNOSIS — R0602 Shortness of breath: Secondary | ICD-10-CM

## 2012-02-01 DIAGNOSIS — I359 Nonrheumatic aortic valve disorder, unspecified: Secondary | ICD-10-CM

## 2012-02-01 MED ORDER — METOPROLOL SUCCINATE ER 50 MG PO TB24: 50.0000 mg | ORAL_TABLET | Freq: Every day | ORAL | Status: DC

## 2012-02-01 NOTE — Assessment & Plan Note (Signed)
No symptoms suggestive of angina. Continue medical therapy. 

## 2012-02-01 NOTE — Assessment & Plan Note (Signed)
Mild to moderate stenosis with a mean gradient of 14 mm mercury.

## 2012-02-01 NOTE — Assessment & Plan Note (Signed)
This resolved completely. I don't think this is related to her known moderate carotid disease. There was suggestion of possible left vertebral artery stenosis. Even if present, that should not cause significant symptoms given that dual vertebral supply. There was no evidence of subclavian artery stenosis.

## 2012-02-01 NOTE — Assessment & Plan Note (Signed)
Currently she is in [place] Heart Association class II but with significant fatigue after increasing the dose of Toprol to 100 mg once daily. I will decrease the dose back to 50 mg once daily and monitor her symptoms. She appears to be euvolemic and overall has not required diuretics.

## 2012-02-01 NOTE — Progress Notes (Signed)
HPI  Robin [name] is a pleasant 78 year old female who is here today for a followup visit. In 2008 she presented with non-ST elevation myocardial infarction, heart failure with severely reduced LV systolic function (EF10-15%) as well as atrial fibrillation. Cardiac catheterization at that time showed severe three-vessel coronary artery disease. She underwent CABG at [facility] by Dr. Earlene Plater as well as Maze procedure. She had heart block that required dual-chamber pacemaker placement. She has done well since then with medical therapy. Her ejection fraction gradually improved. In 2011 and it was 40%. Echo in 2013 showed improvement to 50-55% and most recently 45% at [facility] after atrial fibrillation. She was hospitalized briefly in September at [facility] after she presented with atrial fibrillation with rapid ventricular response. She converted to normal sinus rhythm with diltiazem. The dose of Toprol was increased from 50 to100 mg once daily. This was her first episode of atrial fibrillation since her surgery as far as I am aware.  Since the dose of Toprol was increased, the patient had excessive fatigue and no energy. She had recent dizziness but that improved after cleaning wax from her left ear. She reports no further dizziness. She denies chest pain or dyspnea. She has been going through a difficult time after the death of her 2 sisters in the last 5 months.  Allergies  Allergen Reactions  . Amoxicillin   . Codeine     Nausea/vomiting/diarrhea     Current Outpatient Prescriptions on File Prior to Visit  Medication Sig Dispense Refill  . atorvastatin (LIPITOR) 40 MG tablet Take 1 tablet (40 mg total) by mouth daily.  90 tablet  3  . clonazePAM (KLONOPIN) 0.5 MG tablet Take 1 tablet (0.5 mg total) by mouth 3 (three) times daily as needed.  90 tablet  3  . Cyanocobalamin (VITAMIN B-12 SL) Place 2 sprays under the tongue 3 (three) times daily.      Marland Kitchen FLUoxetine (PROZAC) 40 MG capsule Take 1 capsule (40 mg  total) by mouth daily.  90 capsule  3  . Multiple Vitamins-Minerals (CENTRUM SILVER PO) Take by mouth daily.      . Probiotic Product (ALIGN PO) Take by mouth daily.      . ramipril (ALTACE) 5 MG tablet Take 1 tablet (5 mg total) by mouth daily.  90 tablet  3  . warfarin (COUMADIN) 3 MG tablet Take 3 mg on Monday, Tuesday, Thursday, Friday & Saturday and Sunday. Take 1 1/2 mg on Wednesday.  90 tablet  3     Past Medical History  Diagnosis Date  . Hypertension   . Abdominal aortic aneurysm     4.2 cm followed by Dr. Evette Cristal  . Hyperlipidemia   . CAD (coronary artery disease)     Severe 3 vessel CAD with severe ischemic cardiomyopathy in 12/2006. s/p CABG at [facility] by Dr. Earlene Plater: LIMA to LAD, SVG to OM1, SVG to [facility] and SVG to PDA. cardiac cath 06/11 : patent grafts.   . CHF (congestive heart failure)     w/ejection fraction 10% to 15%  . Atrial fibrillation   . Left bundle branch block   . Sick sinus syndrome     s/p dual chamber pacemaker 01/2007 post CABG and Maze procedure.   . Mitral regurgitation     Moderate to severe by echo but mild by intraoperative TEE in 2008  . Aortic stenosis     Mild to moderate with mild to moderate AI.   Marland Kitchen Carotid artery occlusion  Moderate bilateral with right carotid bruit     Past Surgical History  Procedure Date  . Insert / replace / remove pacemaker 2008    @ [facility]  . Bilateral maze procedure   . Coronary bypass graft stenosis 01/27/07    @ [facility];Dr. Earlene Plater  . Total vaginal hysterectomy 1998  . Cholecystectomy   . Appendectomy   . Breast lumpectomy     left  . Cardiac catheterization 2008    LAD 80% MID, LCX 100%MID, RCA 70%  . Coronary artery bypass graft 2008    not a redo candidate due to calcified aorta     Family History  Problem Relation Age of Onset  . Heart disease Mother      History   Social History  . Marital Status: Married    Spouse Name: N/A    Number of Children: N/A  . Years of Education: N/A    Occupational History  . Not on file.   Social History Main Topics  . Smoking status: Former Smoker -- 0.2 packs/day for 50 years    Types: Cigarettes    Quit date: 08/01/2006  . Smokeless tobacco: Not on file  . Alcohol Use: 0.6 oz/week    1 Glasses of wine per week     Comment: occasional  . Drug Use: No  . Sexually Active:    Other Topics Concern  . Not on file   Social History Narrative   Lives with husband and youngest daughter. Has 5 children.Work - retired in 2000 from [employer]     PHYSICAL EXAM   BP 112/62  Pulse 61  Ht 5\' 2"  (1.575 m)  Wt 101 lb 12 oz (46.153 kg)  BMI 18.61 kg/m2 Constitutional: She is oriented to person, place, and time. She appears well-developed and well-nourished. No distress.  HENT: No nasal discharge.  Head: Normocephalic and atraumatic.  Eyes: Pupils are equal and round. Right eye exhibits no discharge. Left eye exhibits no discharge.  Neck: Normal range of motion. Neck supple. No JVD present. No thyromegaly present. Right carotid bruit. Cardiovascular: Normal rate, regular rhythm, normal heart sounds. Exam reveals no gallop and no friction rub. 2/6 systolic ejection murmur at the aortic area. Pulmonary/Chest: Effort normal and breath sounds normal. No stridor. No respiratory distress. She has no wheezes. She has no rales. She exhibits no tenderness.  Abdominal: Soft. Bowel sounds are normal. She exhibits no distension. There is no tenderness. There is no rebound and no guarding.  Musculoskeletal: Normal range of motion. She exhibits no edema and no tenderness.  Neurological: She is alert and oriented to person, place, and time. Coordination normal.  Skin: Skin is warm and dry. No rash noted. She is not diaphoretic. No erythema. No pallor.  Psychiatric: She has a normal mood and affect. Her behavior is normal. Judgment and thought content normal.     EKG: Electronic atrial pacemaker  -Left bundle branch block.    ASSESSMENT  AND PLAN

## 2012-02-01 NOTE — Assessment & Plan Note (Signed)
No recurrent episodes since September. Continue current management and long-term anticoagulation.

## 2012-02-01 NOTE — Patient Instructions (Addendum)
Decrease Toprol to 50 mg once daily (1/2 tablet of 100 mg until you finish them). A new prescription was sent to [facility].  Follow up in 3 months.

## 2012-02-16 ENCOUNTER — Ambulatory Visit (INDEPENDENT_AMBULATORY_CARE_PROVIDER_SITE_OTHER): Payer: Medicare Other

## 2012-02-16 DIAGNOSIS — Z7901 Long term (current) use of anticoagulants: Secondary | ICD-10-CM

## 2012-02-16 DIAGNOSIS — I4891 Unspecified atrial fibrillation: Secondary | ICD-10-CM

## 2012-03-02 ENCOUNTER — Encounter: Payer: Self-pay | Admitting: Adult Health

## 2012-03-02 ENCOUNTER — Ambulatory Visit (INDEPENDENT_AMBULATORY_CARE_PROVIDER_SITE_OTHER): Payer: Medicare Other | Admitting: Adult Health

## 2012-03-02 VITALS — BP 120/70 | HR 74 | Temp 97.7°F | Ht 62.0 in | Wt 105.0 lb

## 2012-03-02 DIAGNOSIS — J329 Chronic sinusitis, unspecified: Secondary | ICD-10-CM

## 2012-03-02 MED ORDER — FLUTICASONE PROPIONATE 50 MCG/ACT NA SUSP: 2.0000 | Freq: Every day | NASAL | Status: DC

## 2012-03-02 MED ORDER — AZITHROMYCIN 250 MG PO TABS: ORAL_TABLET | ORAL | Status: DC

## 2012-03-02 NOTE — Patient Instructions (Signed)
  You have a viral sinus infection. You can continue to use your saline spray as needed. This will help rinse your sinuses.  Start your Flonase nasal spray. This is 2 sprays in each nostril once daily. This will help with your symptoms.  I have given you a prescription for Azithromycin. Do not start this medication unless you develop a fever; your secretions change color to yellow or green.  If you start Azithromycin, this is an antibiotic that can increase the effect of your coumadin, so we will need to check your PT/INR. At that time, please call the office to schedule a lab visit.

## 2012-03-02 NOTE — Assessment & Plan Note (Signed)
Symptoms consistent with viral sinus infection. Start flonase. Provided patient with prescription for Azithromycin and instructed to take ONLY if she develops fever of if her secretions change to yellow, green. Also instructed to call office to have PT/INR checked if she starts antibiotic.

## 2012-03-02 NOTE — Progress Notes (Signed)
  Subjective:    Patient ID: Robin [name], female    DOB: [birth year], 79 y.o.   MRN: [id number]  HPI  Pt presents with coughing, post nasal drip, head congestion, clear sinus drainage. She denies malaise, fever, chills or other systemic symptoms. Her husband was recently sick with URI.  Current Outpatient Prescriptions on File Prior to Visit  Medication Sig Dispense Refill  . atorvastatin (LIPITOR) 40 MG tablet Take 1 tablet (40 mg total) by mouth daily.  90 tablet  3  . clonazePAM (KLONOPIN) 0.5 MG tablet Take 1 tablet (0.5 mg total) by mouth 3 (three) times daily as needed.  90 tablet  3  . Cyanocobalamin (VITAMIN B-12 SL) Place 2 sprays under the tongue 3 (three) times daily.      Marland Kitchen FLUoxetine (PROZAC) 40 MG capsule Take 1 capsule (40 mg total) by mouth daily.  90 capsule  3  . metoprolol succinate (TOPROL-XL) 50 MG 24 hr tablet Take 1 tablet (50 mg total) by mouth daily. Take with or immediately following a meal.  30 tablet  6  . Multiple Vitamins-Minerals (CENTRUM SILVER PO) Take by mouth daily.      . Probiotic Product (ALIGN PO) Take by mouth daily.      . ramipril (ALTACE) 5 MG tablet Take 1 tablet (5 mg total) by mouth daily.  90 tablet  3  . warfarin (COUMADIN) 3 MG tablet Take 3 mg on Monday, Tuesday, Thursday, Friday & Saturday and Sunday. Take 1 1/2 mg on Wednesday.  90 tablet  3  . fluticasone (FLONASE) 50 MCG/ACT nasal spray Place 2 sprays into the nose daily.  16 g  6     Review of Systems  Constitutional: Positive for appetite change. Negative for fever and chills.  HENT: Positive for congestion, rhinorrhea and postnasal drip. Negative for sore throat.   Cardiovascular: Negative.   Gastrointestinal: Positive for constipation. Negative for nausea, vomiting and diarrhea.  Musculoskeletal: Negative.   Skin: Negative for rash.  Neurological: Positive for dizziness. Negative for light-headedness and headaches.  Psychiatric/Behavioral: Negative.     BP 120/70  Pulse 74   Temp 97.7 F (36.5 C) (Oral)  Ht 5\' 2"  (1.575 m)  Wt 105 lb (47.628 kg)  BMI 19.20 kg/m2  SpO2 96%     Objective:   Physical Exam  Constitutional: She is oriented to person, place, and time. She appears well-developed and well-nourished. No distress.  HENT:  Head: Normocephalic.  Right Ear: External ear normal.  Left Ear: External ear normal.  Mouth/Throat: No oropharyngeal exudate.  Neck: Normal range of motion.  Cardiovascular: Normal rate and regular rhythm.   Murmur heard. Pulmonary/Chest: Effort normal and breath sounds normal.  Abdominal: Soft. Bowel sounds are normal.  Lymphadenopathy:    She has no cervical adenopathy.  Neurological: She is alert and oriented to person, place, and time.  Skin: Skin is warm and dry.  Psychiatric: She has a normal mood and affect. Her behavior is normal. Thought content normal.       Assessment & Plan:

## 2012-03-03 ENCOUNTER — Ambulatory Visit: Payer: Medicare Other | Admitting: Adult Health

## 2012-03-15 ENCOUNTER — Ambulatory Visit (INDEPENDENT_AMBULATORY_CARE_PROVIDER_SITE_OTHER): Payer: Medicare Other

## 2012-03-15 DIAGNOSIS — I4891 Unspecified atrial fibrillation: Secondary | ICD-10-CM

## 2012-03-15 DIAGNOSIS — Z7901 Long term (current) use of anticoagulants: Secondary | ICD-10-CM

## 2012-03-15 DIAGNOSIS — I10 Essential (primary) hypertension: Secondary | ICD-10-CM

## 2012-03-15 LAB — POCT INR: INR: 2.7

## 2012-03-15 MED ORDER — RAMIPRIL 5 MG PO TABS: 5.0000 mg | ORAL_TABLET | Freq: Every day | ORAL | Status: DC

## 2012-04-07 ENCOUNTER — Ambulatory Visit (INDEPENDENT_AMBULATORY_CARE_PROVIDER_SITE_OTHER): Payer: Medicare Other | Admitting: *Deleted

## 2012-04-07 DIAGNOSIS — I509 Heart failure, unspecified: Secondary | ICD-10-CM

## 2012-04-07 DIAGNOSIS — I495 Sick sinus syndrome: Secondary | ICD-10-CM

## 2012-04-07 LAB — PACEMAKER DEVICE OBSERVATION
AL THRESHOLD: 0.8 V
DEVICE MODEL PM: 574689
RV LEAD AMPLITUDE: 9.9 mv

## 2012-04-07 NOTE — Progress Notes (Signed)
PPM check 

## 2012-04-12 ENCOUNTER — Ambulatory Visit (INDEPENDENT_AMBULATORY_CARE_PROVIDER_SITE_OTHER): Payer: Medicare Other

## 2012-04-12 DIAGNOSIS — I4891 Unspecified atrial fibrillation: Secondary | ICD-10-CM

## 2012-04-12 DIAGNOSIS — Z7901 Long term (current) use of anticoagulants: Secondary | ICD-10-CM

## 2012-04-12 LAB — POCT INR: INR: 2

## 2012-04-20 ENCOUNTER — Ambulatory Visit: Payer: Self-pay | Admitting: General Surgery

## 2012-05-01 ENCOUNTER — Encounter: Payer: Self-pay | Admitting: Cardiovascular Disease

## 2012-05-01 ENCOUNTER — Ambulatory Visit (INDEPENDENT_AMBULATORY_CARE_PROVIDER_SITE_OTHER): Payer: Medicare Other | Admitting: Cardiovascular Disease

## 2012-05-01 VITALS — BP 122/60 | HR 60 | Ht 62.0 in | Wt 109.5 lb

## 2012-05-01 DIAGNOSIS — I350 Nonrheumatic aortic (valve) stenosis: Secondary | ICD-10-CM

## 2012-05-01 DIAGNOSIS — I509 Heart failure, unspecified: Secondary | ICD-10-CM

## 2012-05-01 DIAGNOSIS — I359 Nonrheumatic aortic valve disorder, unspecified: Secondary | ICD-10-CM

## 2012-05-01 DIAGNOSIS — I2510 Atherosclerotic heart disease of native coronary artery without angina pectoris: Secondary | ICD-10-CM

## 2012-05-01 DIAGNOSIS — I4891 Unspecified atrial fibrillation: Secondary | ICD-10-CM

## 2012-05-01 NOTE — Assessment & Plan Note (Signed)
She is currently New York Heart Association class II with no evidence of fluid overload. Continue current medications. Most recent ejection fraction was 45%.

## 2012-05-01 NOTE — Progress Notes (Signed)
HPI  Robin [name] is a pleasant 79 year old female who is here today for a followup visit. In 2008 she presented with non-ST elevation myocardial infarction, heart failure with severely reduced LV systolic function (EF10-15%) as well as atrial fibrillation. Cardiac catheterization at that time showed severe three-vessel coronary artery disease. She underwent CABG at [facility] by Dr. Earlene Plater as well as Maze procedure. She had heart block that required dual-chamber pacemaker placement. She has done well since then with medical therapy. Her ejection fraction gradually improved. In 2011 and it was 40%. Echo in 2013 showed improvement to 50-55% and most recently 45% at [facility] after atrial fibrillation. She was hospitalized briefly in September at [facility] after she presented with atrial fibrillation with rapid ventricular response. She converted to normal sinus rhythm with diltiazem. The dose of Toprol was increased from 50 to100 mg once daily. This was her first episode of atrial fibrillation since her surgery as far as I am aware.  She felt very tired after increasing the dose of Toprol and that's during last visit I decided to decrease the dose back to 50 mg once daily. She is feeling significantly better. She denies any chest pain or dyspnea. She's exercising in a regular basis. Her diarrhea has resolved and she gained 9 pounds since then.  Allergies  Allergen Reactions  . Amoxicillin   . Codeine     Nausea/vomiting/diarrhea     Current Outpatient Prescriptions on File Prior to Visit  Medication Sig Dispense Refill  . atorvastatin (LIPITOR) 40 MG tablet Take 1 tablet (40 mg total) by mouth daily.  90 tablet  3  . clonazePAM (KLONOPIN) 0.5 MG tablet Take 1 tablet (0.5 mg total) by mouth 3 (three) times daily as needed.  90 tablet  3  . Cyanocobalamin (VITAMIN B-12 SL) Place 2 sprays under the tongue 3 (three) times daily.      Marland Kitchen FLUoxetine (PROZAC) 40 MG capsule Take 1 capsule (40 mg total) by mouth daily.   90 capsule  3  . metoprolol succinate (TOPROL-XL) 50 MG 24 hr tablet Take 1 tablet (50 mg total) by mouth daily. Take with or immediately following a meal.  30 tablet  6  . Multiple Vitamins-Minerals (CENTRUM SILVER PO) Take by mouth daily.      . Probiotic Product (ALIGN PO) Take by mouth daily.      . ramipril (ALTACE) 5 MG tablet Take 1 tablet (5 mg total) by mouth daily.  90 tablet  3  . warfarin (COUMADIN) 3 MG tablet Take 3 mg on Monday, Tuesday, Thursday, Friday & Saturday and Sunday. Take 1 1/2 mg on Wednesday.  90 tablet  3   No current facility-administered medications on file prior to visit.     Past Medical History  Diagnosis Date  . Hypertension   . Abdominal aortic aneurysm     4.2 cm followed by Dr. Evette Cristal  . Hyperlipidemia   . CAD (coronary artery disease)     Severe 3 vessel CAD with severe ischemic cardiomyopathy in 12/2006. s/p CABG at [facility] by Dr. Earlene Plater: LIMA to LAD, SVG to OM1, SVG to [facility] and SVG to PDA. cardiac cath 06/11 : patent grafts.   . CHF (congestive heart failure)     w/ejection fraction 10% to 15%  . Atrial fibrillation   . Left bundle branch block   . Sick sinus syndrome     s/p dual chamber pacemaker 01/2007 post CABG and Maze procedure.   . Mitral regurgitation  Moderate to severe by echo but mild by intraoperative TEE in 2008  . Aortic stenosis     Mild to moderate with mild to moderate AI.   Marland Kitchen Carotid artery occlusion     Moderate bilateral with right carotid bruit     Past Surgical History  Procedure Laterality Date  . Insert / replace / remove pacemaker  2008    @ [facility]  . Bilateral maze procedure    . Coronary bypass graft stenosis  01/27/07    @ [facility];Dr. Earlene Plater  . Total vaginal hysterectomy  1998  . Cholecystectomy    . Appendectomy    . Breast lumpectomy      left  . Cardiac catheterization  2008    LAD 80% MID, LCX 100%MID, RCA 70%  . Coronary artery bypass graft  2008    not a redo candidate due to calcified aorta      Family History  Problem Relation Age of Onset  . Heart disease Mother      History   Social History  . Marital Status: Married    Spouse Name: N/A    Number of Children: N/A  . Years of Education: N/A   Occupational History  . Not on file.   Social History Main Topics  . Smoking status: Former Smoker -- 0.25 packs/day for 50 years    Types: Cigarettes    Quit date: 08/01/2006  . Smokeless tobacco: Not on file  . Alcohol Use: 0.6 oz/week    1 Glasses of wine per week     Comment: occasional  . Drug Use: No  . Sexually Active:    Other Topics Concern  . Not on file   Social History Narrative   Lives with husband and youngest daughter. Has 5 children.      Work - retired in 2000 from [employer]           PHYSICAL EXAM   BP 122/60  Pulse 60  Ht 5\' 2"  (1.575 m)  Wt 109 lb 8 oz (49.669 kg)  BMI 20.02 kg/m2 Constitutional: She is oriented to person, place, and time. She appears well-developed and well-nourished. No distress.  HENT: No nasal discharge.  Head: Normocephalic and atraumatic.  Eyes: Pupils are equal and round. Right eye exhibits no discharge. Left eye exhibits no discharge.  Neck: Normal range of motion. Neck supple. No JVD present. No thyromegaly present. Right carotid bruit. Cardiovascular: Normal rate, regular rhythm, normal heart sounds. Exam reveals no gallop and no friction rub. 3/6 systolic ejection murmur at the aortic area which is mid peaking with preserved S2. Pulmonary/Chest: Effort normal and breath sounds normal. No stridor. No respiratory distress. She has no wheezes. She has no rales. She exhibits no tenderness.  Abdominal: Soft. Bowel sounds are normal. She exhibits no distension. There is no tenderness. There is no rebound and no guarding.  Musculoskeletal: Normal range of motion. She exhibits no edema and no tenderness.  Neurological: She is alert and oriented to person, place, and time. Coordination normal.  Skin: Skin is  warm and dry. No rash noted. She is not diaphoretic. No erythema. No pallor.  Psychiatric: She has a normal mood and affect. Her behavior is normal. Judgment and thought content normal.     EKG: Electronic atrial pacemaker  -Left bundle branch block.    ASSESSMENT AND PLAN

## 2012-05-01 NOTE — Assessment & Plan Note (Signed)
This was mild to moderate on most recent echocardiogram in July of last year. I will obtain an echocardiogram later this year.

## 2012-05-01 NOTE — Assessment & Plan Note (Signed)
She continues to be in sinus rhythm even after the recent decrease in metoprolol to 50 mg once daily. Continue long-term anticoagulation with warfarin.

## 2012-05-01 NOTE — Patient Instructions (Addendum)
Continue same medications.  Follow up in 6 months.  

## 2012-05-01 NOTE — Assessment & Plan Note (Signed)
She has no symptoms of angina. Continue medical therapy. Most recent cardiac catheterization in June of 2011 showed patent grafts

## 2012-05-04 ENCOUNTER — Ambulatory Visit: Payer: Self-pay | Admitting: General Surgery

## 2012-05-10 ENCOUNTER — Encounter: Payer: Self-pay | Admitting: Internal Medicine

## 2012-05-10 ENCOUNTER — Ambulatory Visit (INDEPENDENT_AMBULATORY_CARE_PROVIDER_SITE_OTHER): Payer: Medicare Other

## 2012-05-10 ENCOUNTER — Ambulatory Visit (INDEPENDENT_AMBULATORY_CARE_PROVIDER_SITE_OTHER): Payer: Medicare Other | Admitting: Internal Medicine

## 2012-05-10 VITALS — BP 148/68 | HR 62 | Temp 98.3°F | Wt 111.0 lb

## 2012-05-10 DIAGNOSIS — I4891 Unspecified atrial fibrillation: Secondary | ICD-10-CM

## 2012-05-10 DIAGNOSIS — R197 Diarrhea, unspecified: Secondary | ICD-10-CM

## 2012-05-10 DIAGNOSIS — Z7901 Long term (current) use of anticoagulants: Secondary | ICD-10-CM

## 2012-05-10 DIAGNOSIS — E785 Hyperlipidemia, unspecified: Secondary | ICD-10-CM

## 2012-05-10 DIAGNOSIS — I2510 Atherosclerotic heart disease of native coronary artery without angina pectoris: Secondary | ICD-10-CM

## 2012-05-10 LAB — COMPREHENSIVE METABOLIC PANEL
ALT: 35 U/L (ref 0–35)
Alkaline Phosphatase: 67 U/L (ref 39–117)
Sodium: 139 mEq/L (ref 135–145)
Total Bilirubin: 1.2 mg/dL (ref 0.3–1.2)
Total Protein: 7.6 g/dL (ref 6.0–8.3)

## 2012-05-10 LAB — POCT INR: INR: 1.5

## 2012-05-10 NOTE — Progress Notes (Signed)
Subjective:    Patient ID: Robin [name], female    DOB: [birth year], 79 y.o.   MRN: [id number]  HPI 79 year old female with history of coronary artery disease, age of fibrillation, hyperlipidemia, hypertension presents for followup. She reports she is feeling very well. She notes that her energy level is the best it has been in years. She denies any symptoms of chest pain, shortness of breath, palpitations. She reports that symptoms of diarrhea have completely resolved after she reduced her dose of metoprolol to 50 mg daily. She was recently seen by her cardiologist and no medication changes were made. She was also seen by her vascular surgeon and decision was made to monitor her plaque in her carotid arteries. She denies any recurrent symptoms of dizziness. She notes that she has been out in her yard gardening and has a bruise on her left anterior lower leg. She has not had any recent falls.  Outpatient Encounter Prescriptions as of 05/10/2012  Medication Sig Dispense Refill  . atorvastatin (LIPITOR) 40 MG tablet Take 1 tablet (40 mg total) by mouth daily.  90 tablet  3  . clonazePAM (KLONOPIN) 0.5 MG tablet Take 1 tablet (0.5 mg total) by mouth 3 (three) times daily as needed.  90 tablet  3  . Cyanocobalamin (VITAMIN B-12 SL) Place 2 sprays under the tongue 3 (three) times daily.      Marland Kitchen FLUoxetine (PROZAC) 40 MG capsule Take 1 capsule (40 mg total) by mouth daily.  90 capsule  3  . metoprolol succinate (TOPROL-XL) 50 MG 24 hr tablet Take 1 tablet (50 mg total) by mouth daily. Take with or immediately following a meal.  30 tablet  6  . Multiple Vitamins-Minerals (CENTRUM SILVER PO) Take by mouth daily.      . Probiotic Product (ALIGN PO) Take by mouth daily.      . ramipril (ALTACE) 5 MG tablet Take 1 tablet (5 mg total) by mouth daily.  90 tablet  3  . warfarin (COUMADIN) 3 MG tablet Take 3 mg on Monday, Tuesday, Thursday, Friday & Saturday and Sunday. Take 1 1/2 mg on Wednesday.  90 tablet  3    No facility-administered encounter medications on file as of 05/10/2012.   BP 148/68  Pulse 62  Temp(Src) 98.3 F (36.8 C) (Oral)  Wt 111 lb (50.349 kg)  BMI 20.3 kg/m2  SpO2 94%  Review of Systems  Constitutional: Negative for fever, chills, appetite change, fatigue and unexpected weight change.  HENT: Negative for ear pain, congestion, sore throat, trouble swallowing, neck pain, voice change and sinus pressure.   Eyes: Negative for visual disturbance.  Respiratory: Negative for cough, shortness of breath, wheezing and stridor.   Cardiovascular: Negative for chest pain, palpitations and leg swelling.  Gastrointestinal: Negative for nausea, vomiting, abdominal pain, diarrhea, constipation, blood in stool, abdominal distention and anal bleeding.  Genitourinary: Negative for dysuria and flank pain.  Musculoskeletal: Negative for myalgias, arthralgias and gait problem.  Skin: Negative for color change and rash.  Neurological: Negative for dizziness and headaches.  Hematological: Negative for adenopathy. Does not bruise/bleed easily.  Psychiatric/Behavioral: Negative for suicidal ideas, sleep disturbance and dysphoric mood. The patient is not nervous/anxious.        Objective:   Physical Exam  Constitutional: She is oriented to person, place, and time. She appears well-developed and well-nourished. No distress.  HENT:  Head: Normocephalic and atraumatic.  Right Ear: External ear normal.  Left Ear: External ear normal.  Nose: Nose normal.  Mouth/Throat: Oropharynx is clear and moist. No oropharyngeal exudate.  Eyes: Conjunctivae are normal. Pupils are equal, round, and reactive to light. Right eye exhibits no discharge. Left eye exhibits no discharge. No scleral icterus.  Neck: Normal range of motion. Neck supple. No tracheal deviation present. No thyromegaly present.  Cardiovascular: Normal rate, regular rhythm, normal heart sounds and intact distal pulses.  Exam reveals no gallop  and no friction rub.   No murmur heard. Pulmonary/Chest: Effort normal and breath sounds normal. No respiratory distress. She has no wheezes. She has no rales. She exhibits no tenderness.  Musculoskeletal: Normal range of motion. She exhibits no edema and no tenderness.  Lymphadenopathy:    She has no cervical adenopathy.  Neurological: She is alert and oriented to person, place, and time. No cranial nerve deficit. She exhibits normal muscle tone. Coordination normal.  Skin: Skin is warm and dry. Ecchymosis noted. No rash noted. She is not diaphoretic. No erythema. No pallor.     Psychiatric: She has a normal mood and affect. Her behavior is normal. Judgment and thought content normal.          Assessment & Plan:

## 2012-05-10 NOTE — Assessment & Plan Note (Signed)
Lab Results  Component Value Date   LDLCALC 45 01/11/2012   Lipids are very well-controlled on atorvastatin. Will plan to repeat in November 2014.

## 2012-05-10 NOTE — Assessment & Plan Note (Signed)
Sinus rhythm on metoprolol. Anticoagulated with Coumadin. Will continue current medications.

## 2012-05-10 NOTE — Assessment & Plan Note (Signed)
Symptomatically doing very well with no symptoms of angina. Energy level is excellent. Continue current medical therapy.

## 2012-05-10 NOTE — Assessment & Plan Note (Signed)
Symptoms of diarrhea completely resolved with reduction in metoprolol dose to 50 mg daily. We'll continue to monitor.

## 2012-05-24 ENCOUNTER — Ambulatory Visit (INDEPENDENT_AMBULATORY_CARE_PROVIDER_SITE_OTHER): Payer: Medicare Other

## 2012-05-24 DIAGNOSIS — I4891 Unspecified atrial fibrillation: Secondary | ICD-10-CM

## 2012-05-24 DIAGNOSIS — Z7901 Long term (current) use of anticoagulants: Secondary | ICD-10-CM

## 2012-06-07 ENCOUNTER — Ambulatory Visit (INDEPENDENT_AMBULATORY_CARE_PROVIDER_SITE_OTHER): Payer: Medicare Other

## 2012-06-07 DIAGNOSIS — I4891 Unspecified atrial fibrillation: Secondary | ICD-10-CM

## 2012-06-07 DIAGNOSIS — Z7901 Long term (current) use of anticoagulants: Secondary | ICD-10-CM

## 2012-06-07 LAB — POCT INR: INR: 1.4

## 2012-06-28 ENCOUNTER — Ambulatory Visit (INDEPENDENT_AMBULATORY_CARE_PROVIDER_SITE_OTHER): Payer: Medicare Other

## 2012-06-28 DIAGNOSIS — Z7901 Long term (current) use of anticoagulants: Secondary | ICD-10-CM

## 2012-06-28 DIAGNOSIS — I4891 Unspecified atrial fibrillation: Secondary | ICD-10-CM

## 2012-06-28 LAB — POCT INR: INR: 2.3

## 2012-07-11 ENCOUNTER — Other Ambulatory Visit: Payer: Self-pay | Admitting: *Deleted

## 2012-07-11 DIAGNOSIS — F419 Anxiety disorder, unspecified: Secondary | ICD-10-CM

## 2012-07-11 MED ORDER — CLONAZEPAM 0.5 MG PO TABS: 0.5000 mg | ORAL_TABLET | Freq: Three times a day (TID) | ORAL | Status: DC | PRN

## 2012-07-26 ENCOUNTER — Ambulatory Visit (INDEPENDENT_AMBULATORY_CARE_PROVIDER_SITE_OTHER): Payer: Medicare Other

## 2012-07-26 DIAGNOSIS — I4891 Unspecified atrial fibrillation: Secondary | ICD-10-CM

## 2012-07-26 DIAGNOSIS — Z7901 Long term (current) use of anticoagulants: Secondary | ICD-10-CM

## 2012-08-23 ENCOUNTER — Ambulatory Visit (INDEPENDENT_AMBULATORY_CARE_PROVIDER_SITE_OTHER): Payer: Medicare Other

## 2012-08-23 DIAGNOSIS — I4891 Unspecified atrial fibrillation: Secondary | ICD-10-CM

## 2012-08-23 DIAGNOSIS — Z7901 Long term (current) use of anticoagulants: Secondary | ICD-10-CM

## 2012-08-23 LAB — POCT INR: INR: 2.7

## 2012-09-06 ENCOUNTER — Encounter (INDEPENDENT_AMBULATORY_CARE_PROVIDER_SITE_OTHER): Payer: Medicare Other

## 2012-09-06 DIAGNOSIS — I6529 Occlusion and stenosis of unspecified carotid artery: Secondary | ICD-10-CM

## 2012-09-07 ENCOUNTER — Ambulatory Visit: Payer: Medicare Other | Admitting: Adult Health

## 2012-09-08 ENCOUNTER — Encounter: Payer: Self-pay | Admitting: *Deleted

## 2012-09-08 ENCOUNTER — Encounter: Payer: Self-pay | Admitting: Internal Medicine

## 2012-09-08 ENCOUNTER — Ambulatory Visit (INDEPENDENT_AMBULATORY_CARE_PROVIDER_SITE_OTHER): Payer: Medicare Other | Admitting: Internal Medicine

## 2012-09-08 VITALS — BP 100/48 | HR 60 | Temp 98.7°F | Resp 14 | Wt 117.8 lb

## 2012-09-08 DIAGNOSIS — I5022 Chronic systolic (congestive) heart failure: Secondary | ICD-10-CM

## 2012-09-08 DIAGNOSIS — R197 Diarrhea, unspecified: Secondary | ICD-10-CM

## 2012-09-08 DIAGNOSIS — R351 Nocturia: Secondary | ICD-10-CM

## 2012-09-08 DIAGNOSIS — I951 Orthostatic hypotension: Secondary | ICD-10-CM

## 2012-09-08 DIAGNOSIS — I509 Heart failure, unspecified: Secondary | ICD-10-CM

## 2012-09-08 DIAGNOSIS — R5383 Other fatigue: Secondary | ICD-10-CM

## 2012-09-08 DIAGNOSIS — Z7901 Long term (current) use of anticoagulants: Secondary | ICD-10-CM

## 2012-09-08 DIAGNOSIS — D649 Anemia, unspecified: Secondary | ICD-10-CM

## 2012-09-08 LAB — IRON AND TIBC
%SAT: 7 % — ABNORMAL LOW (ref 20–55)
TIBC: 398 ug/dL (ref 250–470)
UIBC: 372 ug/dL (ref 125–400)

## 2012-09-08 LAB — CBC WITH DIFFERENTIAL/PLATELET
Basophils Relative: 1 % (ref 0–1)
Eosinophils Absolute: 0.2 10*3/uL (ref 0.0–0.7)
Eosinophils Relative: 4 % (ref 0–5)
HCT: 36.4 % (ref 36.0–46.0)
Hemoglobin: 12.3 g/dL (ref 12.0–15.0)
Lymphs Abs: 1.3 10*3/uL (ref 0.7–4.0)
MCH: 30.9 pg (ref 26.0–34.0)
MCHC: 33.8 g/dL (ref 30.0–36.0)
MCV: 91.5 fL (ref 78.0–100.0)
Monocytes Absolute: 0.8 10*3/uL (ref 0.1–1.0)
Monocytes Relative: 15 % — ABNORMAL HIGH (ref 3–12)
RBC: 3.98 MIL/uL (ref 3.87–5.11)

## 2012-09-08 LAB — POCT URINALYSIS DIPSTICK
Bilirubin, UA: NEGATIVE
Glucose, UA: NEGATIVE
Ketones, UA: NEGATIVE
Nitrite, UA: NEGATIVE
Spec Grav, UA: 1.02
pH, UA: 5.5

## 2012-09-08 NOTE — Progress Notes (Signed)
Patient ID: Robin [name], female   DOB: 12-Jan-1929, 79 y.o.   MRN: [id number]   Patient Active Problem List   Diagnosis Date Noted  . Orthostasis 12/28/2011  . Carotid artery occlusion   . Sinoatrial node dysfunction 10/19/2011  . Diarrhea 10/08/2011  . Long term (current) use of anticoagulants 09/29/2011  . Aortic stenosis   . Right carotid bruit 08/31/2011  . PPM-[employer] 08/31/2011  . Abdominal aortic aneurysm   . Atrial fibrillation   . Hyperlipidemia   . CAD (coronary artery disease)   . CHF (congestive heart failure)   . Mitral regurgitation     Subjective:  CC:   Chief Complaint  Patient presents with  . Follow-up    bp and dizziness no energy. Patient has large bruise on left forarm x 3 days reports a box fell on her arm while in grocery store.    HPI:   Robin Estabrook Witherbyis a 79 y.o. female who presents evaluation of sudden onset of fatigue for the last four days.  She hasn't slept well for 4 days due to frequent nocturia.    She has gained 6 lbs since march.  She has been having loose stools for the last 4 days occurring up to 3 times daily but some are actually formed.  Her stomach feels upset, but she denies nausea and her appetite is normal.   She bruised her right arm at the grocery store 4 days ago when a large bottle of detergent fell off a shelf onto her arm.  She had a lump which has resolved but she has a large ecchymosis.    Past Medical History  Diagnosis Date  . Hypertension   . Abdominal aortic aneurysm     4.2 cm followed by Dr. Evette Cristal  . Hyperlipidemia   . CAD (coronary artery disease)     Severe 3 vessel CAD with severe ischemic cardiomyopathy in 12/2006. s/p CABG at [facility] by Dr. Earlene Plater: LIMA to LAD, SVG to OM1, SVG to [facility] and SVG to PDA. cardiac cath 06/11 : patent grafts.   . CHF (congestive heart failure)     w/ejection fraction 10% to 15%  . Atrial fibrillation   . Left bundle branch block   . Sick sinus syndrome     s/p dual  chamber pacemaker 01/2007 post CABG and Maze procedure.   . Mitral regurgitation     Moderate to severe by echo but mild by intraoperative TEE in 2008  . Aortic stenosis     Mild to moderate with mild to moderate AI.   Marland Kitchen Carotid artery occlusion     Moderate bilateral with right carotid bruit  . Diffuse cystic mastopathy     Past Surgical History  Procedure Laterality Date  . Insert / replace / remove pacemaker  2008    @ [facility]  . Bilateral maze procedure    . Coronary bypass graft stenosis  01/27/07    @ [facility];Dr. Earlene Plater  . Total vaginal hysterectomy  1998  . Cholecystectomy    . Appendectomy    . Breast lumpectomy      left  . Cardiac catheterization  2008    LAD 80% MID, LCX 100%MID, RCA 70%  . Coronary artery bypass graft  2008    not a redo candidate due to calcified aorta  . Abdominal hysterectomy  1997  . Eye surgery  2007       The following portions of the patient's history were reviewed and updated  as appropriate: Allergies, current medications, and problem list.    Review of Systems:   12 Pt  review of systems was negative except those addressed in the HPI,     History   Social History  . Marital Status: Married    Spouse Name: N/A    Number of Children: N/A  . Years of Education: N/A   Occupational History  . Not on file.   Social History Main Topics  . Smoking status: Former Smoker -- 0.25 packs/day for 50 years    Types: Cigarettes    Quit date: 08/01/2006  . Smokeless tobacco: Not on file  . Alcohol Use: 0.6 oz/week    1 Glasses of wine per week     Comment: occasional  . Drug Use: No  . Sexually Active:    Other Topics Concern  . Not on file   Social History Narrative   Lives with husband and youngest daughter. Has 5 children.      Work - retired in 2000 from [employer]          Objective:  BP 100/48  Pulse 60  Temp(Src) 98.7 F (37.1 C) (Oral)  Resp 14  Wt 117 lb 12 oz (53.411 kg)  BMI 21.53 kg/m2  SpO2  97%  General appearance: alert, cooperative and appears stated age Ears: normal TM's and external ear canals both ears Throat: lips, mucosa, and tongue normal; teeth and gums normal Neck: no adenopathy, no carotid bruit, supple, symmetrical, trachea midline and thyroid not enlarged, symmetric, no tenderness/mass/nodules Back: symmetric, no curvature. ROM normal. No CVA tenderness. Lungs: clear to auscultation bilaterally Heart: regular rate and rhythm, S1, S2 normal, no murmur, click, rub or gallop Abdomen: soft, non-tender; bowel sounds normal; no masses,  no organomegaly Pulses: 2+ and symmetric Skin: Skin color, texture, turgor normal. No rashes or lesions Lymph nodes: Cervical, supraclavicular, and axillary nodes normal.  Assessment and Plan:  CHF (congestive heart failure) She has no signs of volume overload on exam but her blood pressure is soft in the setting of feeling dizzy.  Will suspend ramaipril until bp is > 130/80.  Diarrhea Recurrent,  With last episode in March.  Given the lack of abdominal cramping, fevers, wt loss loss and intermittent nature,  Her symptoms are not concerning for infectious colitis.  Recommended increasing the fiber in her diet and prn immodium.   Orthostasis Mild secondary to increased GI  output and relative resulting  Hypotension.  Hold ramipril until bo is over 30/80   Long term (current) use of anticoagulants INR was rechecked today given her recent ecchymosis .  INr was 2.03 ,  No changes.    Updated Medication List Outpatient Encounter Prescriptions as of 09/08/2012  Medication Sig Dispense Refill  . atorvastatin (LIPITOR) 40 MG tablet Take 1 tablet (40 mg total) by mouth daily.  90 tablet  3  . clonazePAM (KLONOPIN) 0.5 MG tablet Take 1 tablet (0.5 mg total) by mouth 3 (three) times daily as needed.  90 tablet  3  . Cyanocobalamin (VITAMIN B-12 SL) Place 2 sprays under the tongue 3 (three) times daily.      Marland Kitchen FLUoxetine (PROZAC) 40 MG  capsule Take 1 capsule (40 mg total) by mouth daily.  90 capsule  3  . metoprolol succinate (TOPROL-XL) 50 MG 24 hr tablet Take 1 tablet (50 mg total) by mouth daily. Take with or immediately following a meal.  30 tablet  6  . Multiple Vitamins-Minerals (CENTRUM SILVER PO)  Take by mouth daily.      . Probiotic Product (ALIGN PO) Take by mouth daily.      Marland Kitchen warfarin (COUMADIN) 3 MG tablet Take 3 mg on Monday, Tuesday, Thursday, Friday & Saturday and Sunday. Take 1 1/2 mg on Wednesday.  90 tablet  3  . [DISCONTINUED] ramipril (ALTACE) 5 MG tablet Take 1 tablet (5 mg total) by mouth daily.  90 tablet  3   No facility-administered encounter medications on file as of 09/08/2012.     Orders Placed This Encounter  Procedures  . Urine culture  . TSH  . Iron and TIBC  . Brain natriuretic peptide  . CBC with Differential  . Protime-INR  . POCT Urinalysis Dipstick    Return in about 1 week (around 09/15/2012).

## 2012-09-08 NOTE — Patient Instructions (Addendum)
Suspend your ramipril until your blood pressure is above 130/80

## 2012-09-09 LAB — TSH: TSH: 3.238 u[IU]/mL (ref 0.350–4.500)

## 2012-09-09 LAB — BRAIN NATRIURETIC PEPTIDE: Brain Natriuretic Peptide: 375.1 pg/mL — ABNORMAL HIGH (ref 0.0–100.0)

## 2012-09-10 ENCOUNTER — Encounter: Payer: Self-pay | Admitting: Internal Medicine

## 2012-09-10 LAB — URINE CULTURE: Colony Count: 8000

## 2012-09-10 NOTE — Assessment & Plan Note (Signed)
Mild secondary to increased GI  output and relative resulting  Hypotension.  Hold ramipril until bo is over 30/80

## 2012-09-10 NOTE — Assessment & Plan Note (Signed)
Recurrent,  With last episode in March.  Given the lack of abdominal cramping, fevers, wt loss loss and intermittent nature,  Her symptoms are not concerning for infectious colitis.

## 2012-09-10 NOTE — Assessment & Plan Note (Addendum)
She has no signs of volume overload on exam but her blood pressure is soft in the setting of feeling dizzy.  Will suspend ramaipril until bp is > 130/80.

## 2012-09-10 NOTE — Assessment & Plan Note (Signed)
INR was rechecked today given her recent ecchymosis .  INr was 2.03 ,  No changes.

## 2012-09-27 ENCOUNTER — Telehealth: Payer: Self-pay | Admitting: *Deleted

## 2012-09-27 ENCOUNTER — Other Ambulatory Visit: Payer: Self-pay | Admitting: Internal Medicine

## 2012-09-27 NOTE — Telephone Encounter (Signed)
Returned call to pt's husband, HR in 120's started racing approx 45-60 mins ago.  Pt is taking Metoprolol XL 50mg  QD, last dosage 09/26/12 in the afternoon.  Pt was previously on Metoprolol XL 100mg  daily, but was decreased in December secondary to increased fatigue.  Advised pt's husband to have pt take 50mg  of Metoprolol now, monitor HR and BP and call back if HR does not slow down.  Husband agrees with plan.

## 2012-09-27 NOTE — Telephone Encounter (Signed)
Called pt to check on status.  Pt states her HR is 66 and BP 120/70 at present after taking Metoprolol XL this am.  Pt will continue to monitor HR and BP and call back with problems.

## 2012-09-27 NOTE — Telephone Encounter (Signed)
Husband called and Robin [name]' bp is 107/65 pulse 121. Please advise

## 2012-09-28 ENCOUNTER — Telehealth: Payer: Self-pay

## 2012-09-28 NOTE — Telephone Encounter (Signed)
Pt states she has been back on Ramipril 5mg  QD x 2 weeks.  Dr Darrick Huntsman advised pt to resume Ramipril once BP >130/80.  Advised pt to continue on current dosage of Toprol and take an extra dosage prn if HR>100.

## 2012-09-28 NOTE — Telephone Encounter (Signed)
Ok. That's fine.

## 2012-09-28 NOTE — Telephone Encounter (Signed)
Pt called with HR readings: 6am-- 80; 7am-- 81; 8am --80; 9am --67.

## 2012-09-28 NOTE — Telephone Encounter (Signed)
Resume Ramipril 5 mg once daily. Continue current dose of Toprol but can use an extra dose prn if HR >100.

## 2012-09-28 NOTE — Telephone Encounter (Signed)
Spoke with pt's daughter BP 140/80 HR 83 at 6am, 138/81 HR 72 7am, 145/80 HR 62 8am, 123/67 HR 76 at 9am.  Pt's daughter states pt is doing better today than yesterday see telephone note in EPIC.  Pt is concerned BP and HR are higher than she normally runs 120/60's and HR 60's. Would like Dr Kirke Corin to review BP and HR readings and advise.

## 2012-10-04 ENCOUNTER — Ambulatory Visit (INDEPENDENT_AMBULATORY_CARE_PROVIDER_SITE_OTHER): Payer: Medicare Other

## 2012-10-04 DIAGNOSIS — I4891 Unspecified atrial fibrillation: Secondary | ICD-10-CM

## 2012-10-04 DIAGNOSIS — Z7901 Long term (current) use of anticoagulants: Secondary | ICD-10-CM

## 2012-10-04 LAB — POCT INR: INR: 1.8

## 2012-10-10 ENCOUNTER — Encounter: Payer: Self-pay | Admitting: Internal Medicine

## 2012-10-10 ENCOUNTER — Ambulatory Visit (INDEPENDENT_AMBULATORY_CARE_PROVIDER_SITE_OTHER): Payer: Medicare Other | Admitting: Internal Medicine

## 2012-10-10 VITALS — BP 146/66 | HR 60 | Ht 63.0 in | Wt 117.2 lb

## 2012-10-10 DIAGNOSIS — F411 Generalized anxiety disorder: Secondary | ICD-10-CM

## 2012-10-10 DIAGNOSIS — I4891 Unspecified atrial fibrillation: Secondary | ICD-10-CM

## 2012-10-10 DIAGNOSIS — I495 Sick sinus syndrome: Secondary | ICD-10-CM

## 2012-10-10 DIAGNOSIS — F419 Anxiety disorder, unspecified: Secondary | ICD-10-CM

## 2012-10-10 DIAGNOSIS — I2510 Atherosclerotic heart disease of native coronary artery without angina pectoris: Secondary | ICD-10-CM

## 2012-10-10 DIAGNOSIS — Z950 Presence of cardiac pacemaker: Secondary | ICD-10-CM

## 2012-10-10 LAB — PACEMAKER DEVICE OBSERVATION
AL AMPLITUDE: 1.9 mv
AL THRESHOLD: 0.6 V
DEVICE MODEL PM: 574689
RV LEAD AMPLITUDE: 10 mv
VENTRICULAR PACING PM: 6

## 2012-10-10 NOTE — Progress Notes (Signed)
Patient Care Team: Wynona Dove, MD as PCP - General (Internal Medicine)   HPI  Robin [name] is a 79 y.o. female Seen in pacemaker followup. It was implanted 2008 for sinus node dysfunction.  At that time, she suffered a non-STEMI complicated by heart failure and atrial fibrillation. She has severe three-vessel disease and underwent CABG and Maze at [facility] and since then has done increasingly well with most recent assessment of ejection fraction July 2013 having gone up to 50-55%.  She had been feeling well until about 2 weeks ago. At that time she had an episode characterized by high blood pressure and high heart rate. She took extra metoprolol for a couple of days. The heart rate and blood pressure issues resolved but she has continued to just feel not well. There is some fatigue and some shortness of breath. There is no accompanying chest discomfort   She is also quite anxious. She is not sleeping well and she wonders whether it is related to anxiety deriving from the death of her sisters and brother-in-law  She is on Coumadin. Alternative anticoagulants were discussed with the co-pay were very high     Past Medical History  Diagnosis Date  . Hypertension   . Abdominal aortic aneurysm     4.2 cm followed by Dr. Evette Cristal  . Hyperlipidemia   . CAD (coronary artery disease)     Severe 3 vessel CAD with severe ischemic cardiomyopathy in 12/2006. s/p CABG at [facility] by Dr. Earlene Plater: LIMA to LAD, SVG to OM1, SVG to [facility] and SVG to PDA. cardiac cath 06/11 : patent grafts.   . CHF (congestive heart failure)     w/ejection fraction 10% to 15%  . Atrial fibrillation   . Left bundle branch block   . Sick sinus syndrome     s/p dual chamber pacemaker 01/2007 post CABG and Maze procedure.   . Mitral regurgitation     Moderate to severe by echo but mild by intraoperative TEE in 2008  . Aortic stenosis     Mild to moderate with mild to moderate AI.   Marland Kitchen Carotid artery occlusion     Moderate  bilateral with right carotid bruit  . Diffuse cystic mastopathy     Past Surgical History  Procedure Laterality Date  . Insert / replace / remove pacemaker  2008    @ [facility]  . Bilateral maze procedure    . Coronary bypass graft stenosis  01/27/07    @ [facility];Dr. Earlene Plater  . Total vaginal hysterectomy  1998  . Cholecystectomy    . Appendectomy    . Breast lumpectomy      left  . Cardiac catheterization  2008    LAD 80% MID, LCX 100%MID, RCA 70%  . Coronary artery bypass graft  2008    not a redo candidate due to calcified aorta  . Abdominal hysterectomy  1997  . Eye surgery  2007    Current Outpatient Prescriptions  Medication Sig Dispense Refill  . atorvastatin (LIPITOR) 40 MG tablet Take 1 tablet (40 mg total) by mouth daily.  90 tablet  3  . clonazePAM (KLONOPIN) 0.5 MG tablet Take 1 tablet (0.5 mg total) by mouth 3 (three) times daily as needed.  90 tablet  3  . Cyanocobalamin (VITAMIN B-12 SL) Place 2 sprays under the tongue 3 (three) times daily.      Marland Kitchen FLUoxetine (PROZAC) 40 MG capsule Take 1 capsule (40 mg total) by mouth daily.  90  capsule  3  . metoprolol succinate (TOPROL-XL) 50 MG 24 hr tablet Take 1 tablet (50 mg total) by mouth daily. Take with or immediately following a meal.  30 tablet  6  . Multiple Vitamins-Minerals (CENTRUM SILVER PO) Take by mouth daily.      . Probiotic Product (ALIGN PO) Take by mouth daily.      . ramipril (ALTACE) 5 MG tablet Take 5 mg by mouth daily.      Marland Kitchen warfarin (COUMADIN) 3 MG tablet TAKE 1 TABLET BY MOUTH EVERY MONDAY, TUESDAY, THURSDAY, FRIDAY, SATURDAY AND SUNDAY AND 1 1/2 TABLETS ON WEDNESDAY  90 tablet  0   No current facility-administered medications for this visit.    Allergies  Allergen Reactions  . Amoxicillin   . Codeine     Nausea/vomiting/diarrhea    Review of Systems negative except from HPI and PMH  Physical Exam BP 146/66  Pulse 60  Ht 5\' 3"  (1.6 m)  Wt 117 lb 4 oz (53.184 kg)  BMI 20.78 kg/m2 Well developed  and well nourished in no acute distress HENT normal E scleral and icterus clear Neck Supple JVP flat; carotids brisk and full Clear to ausculation  Regular rate and rhythm, 2-3/6 scratchy systolic murmur Soft with active bowel sounds murmur versus bruit without expanded abdominal pulsation No clubbing cyanosis none Edema Alert and oriented, grossly normal motor and sensory function Skin Warm and Dry  ECG demonstrates atrial pacing with intrinsic conduction left bundle branch block  Assessment and  Plan

## 2012-10-10 NOTE — Assessment & Plan Note (Signed)
As noted, this may be related to the death of her sisters. She may need some grief counseling r pharmacological intervention

## 2012-10-10 NOTE — Assessment & Plan Note (Signed)
The patient's device was interrogated.  The information was reviewed. No changes were made in the programming.    

## 2012-10-10 NOTE — Assessment & Plan Note (Signed)
As above.

## 2012-10-10 NOTE — Patient Instructions (Addendum)
Follow-up with device clinic in 6 months.  Follow-up in 12 months with Dr Graciela Husbands.  Please schedule Echo to be done in 2 weeks same day follow up with Dr. Kirke Corin

## 2012-10-10 NOTE — Assessment & Plan Note (Signed)
Her episode 2 weeks ago associated with hypertension-relative and tachycardia-relative may have been caused by atrial arrhythmia. We do have evidence of atrial tachycardias that are associated with a heart rate of 100 beats per minute or so in her. I would've expected this to mode switch however I don't see a lot of that  Another possible explanation could have been an ischemic event. She has left bundle branch block making it difficult to know from the ECG. The fact that she is having ongoing residual fatigue makes me concerned that there was a significant event. We'll obtain an echo and have her followup with Dr. Marcheta Grammes.Marland Kitchen

## 2012-10-24 ENCOUNTER — Other Ambulatory Visit: Payer: Self-pay

## 2012-10-24 ENCOUNTER — Ambulatory Visit (INDEPENDENT_AMBULATORY_CARE_PROVIDER_SITE_OTHER): Payer: Medicare Other | Admitting: Cardiovascular Disease

## 2012-10-24 ENCOUNTER — Encounter: Payer: Self-pay | Admitting: Cardiovascular Disease

## 2012-10-24 ENCOUNTER — Other Ambulatory Visit (INDEPENDENT_AMBULATORY_CARE_PROVIDER_SITE_OTHER): Payer: Medicare Other

## 2012-10-24 VITALS — BP 124/60 | HR 60 | Ht 62.0 in | Wt 116.8 lb

## 2012-10-24 DIAGNOSIS — R079 Chest pain, unspecified: Secondary | ICD-10-CM

## 2012-10-24 DIAGNOSIS — I4891 Unspecified atrial fibrillation: Secondary | ICD-10-CM

## 2012-10-24 DIAGNOSIS — I2510 Atherosclerotic heart disease of native coronary artery without angina pectoris: Secondary | ICD-10-CM

## 2012-10-24 DIAGNOSIS — I350 Nonrheumatic aortic (valve) stenosis: Secondary | ICD-10-CM

## 2012-10-24 DIAGNOSIS — I509 Heart failure, unspecified: Secondary | ICD-10-CM

## 2012-10-24 DIAGNOSIS — I495 Sick sinus syndrome: Secondary | ICD-10-CM

## 2012-10-24 DIAGNOSIS — I359 Nonrheumatic aortic valve disorder, unspecified: Secondary | ICD-10-CM

## 2012-10-24 NOTE — Assessment & Plan Note (Signed)
She did have some episodes of chest pain recently in the setting of increased heart rate and palpitations. She feels close to baseline. Continue to monitor symptoms. A stress test can be considered for recurrent chest pain. Some of her symptoms seem to be related to prolonged grief and anxiety.

## 2012-10-24 NOTE — Progress Notes (Signed)
HPI  Robin [name] is a pleasant 79 year old female who is here today for a followup visit. In 2008 she presented with non-ST elevation myocardial infarction, heart failure with severely reduced LV systolic function (EF10-15%) as well as atrial fibrillation. Cardiac catheterization at that time showed severe three-vessel coronary artery disease. She underwent CABG at [facility] by Dr. Earlene Plater as well as Maze procedure. She had heart block that required dual-chamber pacemaker placement. She has done well since then with medical therapy. Her ejection fraction gradually improved. In 2011 and it was 40%. Echo in 2013 showed improvement to 50-55% and most recently 45% at [facility] after atrial fibrillation. She was hospitalized briefly in September, 2013 at [facility] with atrial fibrillation with rapid ventricular response. She converted to normal sinus rhythm with diltiazem. The dose of Toprol was increased from 50 to100 mg once daily. She felt very tired after increasing the dose of Toprol and I decided to decrease the dose back to 50 mg once daily.  She had recent episodes of palpitations and heart rate going up to 100. During that time, she felt prior with increased dyspnea. She saw Dr. Graciela Husbands during that time who ordered an echocardiogram which was reviewed by me today and shows an ejection fraction of 45-50%, moderate mitral regurgitation and moderate aortic stenosis. No new findings since the most recent echo.  She called our office and we instructed her to increase the dose of Toprol 100 mg daily until the heart rate goes back to baseline. She did that for 3 days and currently she is back on 50 mg once daily. She feels close to her baseline. She did have some episodes of chest discomfort but overall improved. She's been under significant stress and anxiety as she lost her 2 sisters and  brother-in-law over the last year.   Allergies  Allergen Reactions  . Amoxicillin   . Codeine     Nausea/vomiting/diarrhea      Current Outpatient Prescriptions on File Prior to Visit  Medication Sig Dispense Refill  . atorvastatin (LIPITOR) 40 MG tablet Take 1 tablet (40 mg total) by mouth daily.  90 tablet  3  . clonazePAM (KLONOPIN) 0.5 MG tablet Take 1 tablet (0.5 mg total) by mouth 3 (three) times daily as needed.  90 tablet  3  . Cyanocobalamin (VITAMIN B-12 SL) Place 2 sprays under the tongue 3 (three) times daily.      Marland Kitchen FLUoxetine (PROZAC) 40 MG capsule Take 1 capsule (40 mg total) by mouth daily.  90 capsule  3  . metoprolol succinate (TOPROL-XL) 50 MG 24 hr tablet Take 1 tablet (50 mg total) by mouth daily. Take with or immediately following a meal.  30 tablet  6  . Multiple Vitamins-Minerals (CENTRUM SILVER PO) Take by mouth daily.      . Probiotic Product (ALIGN PO) Take by mouth daily.      . ramipril (ALTACE) 5 MG tablet Take 5 mg by mouth daily.      Marland Kitchen warfarin (COUMADIN) 3 MG tablet TAKE 1 TABLET BY MOUTH EVERY MONDAY, TUESDAY, THURSDAY, FRIDAY, SATURDAY AND SUNDAY AND 1 1/2 TABLETS ON WEDNESDAY  90 tablet  0   No current facility-administered medications on file prior to visit.     Past Medical History  Diagnosis Date  . Hypertension   . Abdominal aortic aneurysm     4.2 cm followed by Dr. Evette Cristal  . Hyperlipidemia   . CAD (coronary artery disease)     Severe 3  vessel CAD with severe ischemic cardiomyopathy in 12/2006. s/p CABG at [facility] by Dr. Earlene Plater: LIMA to LAD, SVG to OM1, SVG to [facility] and SVG to PDA. cardiac cath 06/11 : patent grafts.   . CHF (congestive heart failure)     w/ejection fraction 10% to 15%  . Atrial fibrillation   . Left bundle branch block   . Sick sinus syndrome     s/p dual chamber pacemaker 01/2007 post CABG and Maze procedure.   . Mitral regurgitation     Moderate to severe by echo but mild by intraoperative TEE in 2008  . Aortic stenosis     Mild to moderate with mild to moderate AI.   Marland Kitchen Carotid artery occlusion     Moderate bilateral with right carotid bruit   . Diffuse cystic mastopathy      Past Surgical History  Procedure Laterality Date  . Insert / replace / remove pacemaker  2008    @ [facility]  . Bilateral maze procedure    . Coronary bypass graft stenosis  01/27/07    @ [facility];Dr. Earlene Plater  . Total vaginal hysterectomy  1998  . Cholecystectomy    . Appendectomy    . Breast lumpectomy      left  . Cardiac catheterization  2008    LAD 80% MID, LCX 100%MID, RCA 70%  . Coronary artery bypass graft  2008    not a redo candidate due to calcified aorta  . Abdominal hysterectomy  1997  . Eye surgery  2007     Family History  Problem Relation Age of Onset  . Heart disease Mother      History   Social History  . Marital Status: Married    Spouse Name: N/A    Number of Children: N/A  . Years of Education: N/A   Occupational History  . Not on file.   Social History Main Topics  . Smoking status: Former Smoker -- 0.25 packs/day for 50 years    Types: Cigarettes    Quit date: 08/01/2006  . Smokeless tobacco: Not on file  . Alcohol Use: 0.6 oz/week    1 Glasses of wine per week     Comment: occasional  . Drug Use: No  . Sexual Activity:    Other Topics Concern  . Not on file   Social History Narrative   Lives with husband and youngest daughter. Has 5 children.      Work - retired in 2000 from [employer]           PHYSICAL EXAM   There were no vitals taken for this visit. Constitutional: She is oriented to person, place, and time. She appears well-developed and well-nourished. No distress.  HENT: No nasal discharge.  Head: Normocephalic and atraumatic.  Eyes: Pupils are equal and round. Right eye exhibits no discharge. Left eye exhibits no discharge.  Neck: Normal range of motion. Neck supple. No JVD present. No thyromegaly present. Right carotid bruit. Cardiovascular: Normal rate, regular rhythm, normal heart sounds. Exam reveals no gallop and no friction rub. 3/6 systolic ejection murmur at the aortic area  which is mid peaking with preserved S2. Pulmonary/Chest: Effort normal and breath sounds normal. No stridor. No respiratory distress. She has no wheezes. She has no rales. She exhibits no tenderness.  Abdominal: Soft. Bowel sounds are normal. She exhibits no distension. There is no tenderness. There is no rebound and no guarding.  Musculoskeletal: Normal range of motion. She exhibits no edema and no  tenderness.  Neurological: She is alert and oriented to person, place, and time. Coordination normal.  Skin: Skin is warm and dry. No rash noted. She is not diaphoretic. No erythema. No pallor.  Psychiatric: She has a normal mood and affect. Her behavior is normal. Judgment and thought content normal.     EKG: Electronic atrial pacemaker  -Left bundle branch block.    ASSESSMENT AND PLAN

## 2012-10-24 NOTE — Assessment & Plan Note (Signed)
This is moderate and unchanged from last year. Continue to monitor with an echocardiogram once a year.

## 2012-10-24 NOTE — Assessment & Plan Note (Signed)
Echocardiogram today was 45-50%. She appears to be euvolemic. Continue treatment with Toprol and Ramipril.

## 2012-10-24 NOTE — Assessment & Plan Note (Signed)
Continued treatment with Toprol once daily and long-term anticoagulation with warfarin.

## 2012-10-24 NOTE — Patient Instructions (Addendum)
Continue same medications.  Follow up in 6 months.  

## 2012-10-25 ENCOUNTER — Encounter: Payer: Self-pay | Admitting: Internal Medicine

## 2012-10-26 ENCOUNTER — Telehealth: Payer: Self-pay

## 2012-10-26 NOTE — Telephone Encounter (Signed)
Can you please review the Echo that Dr. Graciela Husbands sent.  Result Narrative    *[facility]* [address] [place], [place] [address] [phone]  ------------------------------------------------------------ Transthoracic Echocardiography  Patient: [name], Robin MR #: [id number] Study Date: 10/24/2012 Gender: F Age: 79 Height: 160cm Weight: 54.4kg BSA: 1.19m^2 Pt. Status: Room:  PERFORMING [place], [place] REFERRING [facility] Sherryl Manges Md ORDERING Sherryl Manges Md REFERRING Sherryl Manges Md SONOGRAPHER Greggory Stallion, RDCS cc:  ------------------------------------------------------------ LV EF: 45% - 50%  ------------------------------------------------------------ History: PMH: Atrial fibrillation.  ------------------------------------------------------------ Study Conclusions  - Left ventricle: The cavity size was normal. Wall thickness was increased in a pattern of mild LVH. Systolic function was mildly reduced. The estimated ejection fraction was in the range of 45% to 50%. Wall motion was normal; there were no regional wall motion abnormalities. - Aortic valve: There was moderate stenosis. Mild regurgitation. Mean gradient: 14mm Hg (S). Valve area: 1.15cm^2(VTI). - Mitral valve: Calcified annulus. Moderate regurgitation. - Left atrium: The atrium was mildly dilated. - Impressions: AAA 3.6cm x 4.4cm Transthoracic echocardiography. M-mode, complete 2D, spectral Doppler, and color Doppler. Height: Height: 160cm. Height: 63in. Weight: Weight: 54.4kg. Weight: 119.8lb. Body mass index: BMI: 21.3kg/m^2. Body surface area: BSA: 1.52m^2. Blood pressure: 145/70. Patient status: Outpatient.  ------------------------------------------------------------  ------------------------------------------------------------ Left ventricle: The cavity size was normal. Wall thickness was increased in a pattern of mild LVH. Systolic function was mildly  reduced. The estimated ejection fraction was in the range of 45% to 50%. Wall motion was normal; there were no regional wall motion abnormalities.  ------------------------------------------------------------ Aortic valve: Trileaflet; normal thickness, moderately calcified leaflets. Doppler: There was moderate stenosis. Mild regurgitation. VTI ratio of LVOT to aortic valve: 0.37. Valve area: 1.15cm^2(VTI). Peak velocity ratio of LVOT to aortic valve: 0.34. Valve area: 1.07cm^2 (Vmax). Mean gradient: 14mm Hg (S). Peak gradient: 29mm Hg (S).  ------------------------------------------------------------ Aorta: Aortic root: The aortic root was normal in size.  ------------------------------------------------------------ Mitral valve: Calcified annulus. Mobility was not restricted. Doppler: Transvalvular velocity was within the normal range. There was no evidence for stenosis. Moderate regurgitation. Valve area by continuity equation (using LVOT flow): 3.3cm^2. Indexed valve area by continuity equation (using LVOT flow): 2.12cm^2/m^2. Mean gradient: 1mm Hg (D). Peak gradient: 4mm Hg (D).  ------------------------------------------------------------ Left atrium: The atrium was mildly dilated.  ------------------------------------------------------------ Right ventricle: The cavity size was normal. Wall thickness was normal. Systolic function was normal.  ------------------------------------------------------------ Pulmonic valve: Poorly visualized. Doppler: Transvalvular velocity was within the normal range. There was no evidence for stenosis. Trivial regurgitation.  ------------------------------------------------------------ Tricuspid valve: Structurally normal valve. Doppler: Transvalvular velocity was within the normal range. Mild regurgitation.  ------------------------------------------------------------ Pulmonary artery: The main pulmonary artery was normal-sized. Systolic  pressure was at the upper limits of normal.  ------------------------------------------------------------ Right atrium: The atrium was normal in size.  ------------------------------------------------------------ Pericardium: There was no pericardial effusion.  ------------------------------------------------------------ Systemic veins: Inferior vena cava: Not visualized. The vessel was normal in size.  ------------------------------------------------------------  2D measurements Normal Doppler measurements Normal Left ventricle LVOT LVID ED, 38.7 mm 43-52 Peak vel, 91.2 cm/s ------ chord, S PLAX VTI, S 22.9 cm ------ LVID ES, 25.4 mm 23-38 Aortic valve chord, Peak vel, 266 cm/s ------ PLAX S FS, chord, 34 % >29 Mean vel, 175 cm/s ------ PLAX S LVPW, ED 11.9 mm ------ VTI, S 62.5 cm ------ IVS/LVPW 1.13 <1.3 Mean 14 mm Hg ------ ratio, ED gradient, Vol ED, 85 ml ------ S MOD1 Peak 29 mm Hg ------ Vol ES, 35 ml ------ gradient, MOD1 S  EF, MOD1 59 % ------ VTI ratio 0.37 ------ Vol index, 54 ml/m^2 ------ LVOT/AV ED, MOD1 Area, VTI 1.15 cm^2 ------ Vol index, 22 ml/m^2 ------ Peak vel 0.34 ------ ES, MOD1 ratio, Vol ED, 104 ml ------ LVOT/AV MOD2 Area, Vmax 1.07 cm^2 ------ Vol ES, 41 ml ------ Regurg PHT 719 ms ------ MOD2 Mitral valve EF, MOD2 61 % ------ Peak E vel 99.2 cm/s ------ Stroke 63 ml ------ Peak A vel 56.8 cm/s ------ vol, MOD2 Mean vel, 39.3 cm/s ------ Vol index, 67 ml/m^2 ------ D ED, MOD2 Decelerati 116 ms 150-23 Vol index, 26 ml/m^2 ------ on time 0 ES, MOD2 Mean 1 mm Hg ------ Stroke 40.4 ml/m^2 ------ gradient, index, D MOD2 Peak 4 mm Hg ------ Ventricular septum gradient, IVS, ED 13.5 mm ------ D LVOT Peak E/A 1.7 ------ Diam, S 20 mm ------ ratio Area 3.14 cm^2 ------ Area 3.3 cm^2 ------ Aorta (LVOT) Root diam, 32 mm ------ continuity ED Area index 2.12 cm^2/m ------ Left atrium (LVOT ^2 AP dim 44 mm ------ cont) AP dim 2.82 cm/m^2  <2.2 Annulus 21.8 cm ------ index VTI Vol, S 24 ml ------ Max regurg 480 cm/s ------ Vol index, 15.4 ml/m^2 ------ vel S Regurg VTI 194 cm ------ Right ventricle Tricuspid valve RVID ED, 35.2 mm 19-38 Regurg 267 cm/s ------ PLAX peak vel Peak RV-RA 29 mm Hg ------ gradient, S Max regurg 267 cm/s ------ vel Pulmonic valve Peak vel, 98.6 cm/s ------ S Regurg 111 cm/s ------ vel, ED  ------------------------------------------------------------ Prepared and Electronically Authenticated by  Lorine Bears [id number]

## 2012-10-26 NOTE — Telephone Encounter (Signed)
Spoke w/ pt.  She is aware of the results and states that the aneurysm is an "ongoing problem" and she was already aware of that.  Pt sched to see Dr. Kirke Corin 9/16  @ 3:45.   Notes Recorded by Festus Aloe, CMA on 10/26/2012 at 10:37 AM Please review results from Dr. Graciela Husbands. ------  Notes Recorded by Duke Salvia, MD on 10/25/2012 at 7:59 PM Please Inform Patient that echo shows some worseing of heart strength 55>>40-45% and mild narrowing of aortic valve. She is also noted to have aneurysm and she is to followup with Dr MA Thanks

## 2012-10-26 NOTE — Progress Notes (Signed)
Please review results from Dr. Graciela Husbands.

## 2012-11-06 ENCOUNTER — Telehealth: Payer: Self-pay

## 2012-11-06 NOTE — Telephone Encounter (Signed)
NA x 1

## 2012-11-06 NOTE — Telephone Encounter (Signed)
Message copied by Festus Aloe on Mon Nov 06, 2012  2:25 PM ------      Message from: Lorine Bears A      Created: Mon Nov 06, 2012  2:12 PM       I see that she has a scheduled follow up visit with me to discuss echo results.       I already discussed echo results with her during recent visit. No need for an office visit in September. She can follow up as was planned during last visit with me.  ------

## 2012-11-07 NOTE — Telephone Encounter (Signed)
Patient notified that the appointment in September can be cancelled per Dr. Kirke Corin unless the patient is having any problems. The patient agrees since Dr. Kirke Corin has already given results of Echo she does not have any need to come in September for a follow up. The patient will call our office if she has any further problems.

## 2012-11-08 ENCOUNTER — Ambulatory Visit (INDEPENDENT_AMBULATORY_CARE_PROVIDER_SITE_OTHER): Payer: Medicare Other

## 2012-11-08 DIAGNOSIS — I4891 Unspecified atrial fibrillation: Secondary | ICD-10-CM

## 2012-11-08 DIAGNOSIS — Z7901 Long term (current) use of anticoagulants: Secondary | ICD-10-CM

## 2012-11-08 LAB — POCT INR: INR: 2.4

## 2012-11-09 ENCOUNTER — Encounter: Payer: Self-pay | Admitting: *Deleted

## 2012-11-10 ENCOUNTER — Ambulatory Visit (INDEPENDENT_AMBULATORY_CARE_PROVIDER_SITE_OTHER): Payer: Medicare Other | Admitting: Internal Medicine

## 2012-11-10 ENCOUNTER — Encounter: Payer: Self-pay | Admitting: Internal Medicine

## 2012-11-10 VITALS — BP 132/62 | HR 60 | Temp 98.2°F | Ht 62.0 in | Wt 117.0 lb

## 2012-11-10 DIAGNOSIS — Z23 Encounter for immunization: Secondary | ICD-10-CM

## 2012-11-10 DIAGNOSIS — Z0000 Encounter for general adult medical examination without abnormal findings: Secondary | ICD-10-CM

## 2012-11-10 DIAGNOSIS — R5381 Other malaise: Secondary | ICD-10-CM

## 2012-11-10 DIAGNOSIS — R34 Anuria and oliguria: Secondary | ICD-10-CM

## 2012-11-10 LAB — COMPREHENSIVE METABOLIC PANEL
ALT: 23 U/L (ref 0–35)
BUN: 26 mg/dL — ABNORMAL HIGH (ref 6–23)
CO2: 24 mEq/L (ref 19–32)
Calcium: 9.1 mg/dL (ref 8.4–10.5)
Chloride: 105 mEq/L (ref 96–112)
Creatinine, Ser: 1.1 mg/dL (ref 0.4–1.2)
GFR: 48.45 mL/min — ABNORMAL LOW (ref >60.00)
Glucose, Bld: 79 mg/dL (ref 70–99)

## 2012-11-10 LAB — CBC WITH DIFFERENTIAL/PLATELET
Basophils Absolute: 0.1 10*3/uL (ref 0.0–0.1)
Hemoglobin: 11.9 g/dL — ABNORMAL LOW (ref 12.0–15.0)
Lymphocytes Relative: 23.1 % (ref 12.0–46.0)
Monocytes Relative: 11.3 % (ref 3.0–12.0)
Neutro Abs: 3.5 10*3/uL (ref 1.4–7.7)
RDW: 14.4 % (ref 11.5–14.6)

## 2012-11-10 LAB — LIPID PANEL
Cholesterol: 170 mg/dL (ref 0–200)
HDL: 64.4 mg/dL (ref >39.00)
VLDL: 28.2 mg/dL (ref 0.0–40.0)

## 2012-11-10 LAB — VITAMIN B12: Vitamin B-12: 313 pg/mL (ref 211–911)

## 2012-11-10 NOTE — Assessment & Plan Note (Signed)
Chronic mild fatigue. Suspect multifactorial related to age, chronic illnesses. Generally, she appears to be doing well with no focal complaints. Will check labs today including CBC, CMP, TSH, B12.

## 2012-11-10 NOTE — Assessment & Plan Note (Signed)
General medical exam including breast exam normal today. Pap and pelvic deferred given patient's age and history of normal Pap in the past. Colonoscopy is up-to-date. Mammogram is up-to-date. Encouraged continued efforts at healthy diet and regular physical activity with walking and lifting weights as she is doing. Flu vaccine given today. Appropriate screening performed.

## 2012-11-10 NOTE — Progress Notes (Signed)
Subjective:    Patient ID: Robin [name], female    DOB: 1928/08/05, 79 y.o.   MRN: [id number]  HPI The patient is here for annual Medicare wellness examination and management of other chronic and acute problems.   The risk factors are reflected in the social history.  The roster of all physicians providing medical care to patient - is listed in the Snapshot section of the chart.  Activities of daily living:  The patient is 100% independent in all ADLs: dressing, toileting, feeding as well as independent mobility  Home safety : The patient has smoke detectors in the home. They wear seatbelts.  There are no firearms at home. There is no violence in the home.   There is no risks for hepatitis, STDs or HIV. There is no history of blood transfusion. They have no travel history to infectious disease endemic areas of the world.  The patient has seen their dentist in the last six month. [employer] They have not seen their eye doctor in the last year. [facility] Wears hearing aids every day. They have deferred audiologic testing in the last year.   They do not  have excessive sun exposure. Discussed the need for sun protection: hats, long sleeves and use of sunscreen if there is significant sun exposure. Derm - none  Diet: the importance of a healthy diet is discussed. They do have a healthy diet.  The benefits of regular aerobic exercise were discussed. She walk every other day , and lifts weights daily.   Depression screen: there are no signs or vegative symptoms of depression- irritability, change in appetite, anhedonia, sadness/tearfullness. She notes some sadness with passing of her siblings, but feels that she is coping well.  Cognitive assessment: the patient manages all their financial and personal affairs and is actively engaged. They could relate day,date,year and events.  The following portions of the patient's history were reviewed and updated as appropriate:  allergies, current medications, past family history, past medical history,  past surgical history, past social history  and problem list.  Visual acuity was not assessed per patient preference since she has regular follow up with her ophthalmologist. Hearing and body mass index were assessed and reviewed.   During the course of the visit the patient was educated and counseled about appropriate screening and preventive services including : fall prevention , diabetes screening, nutrition counseling, colorectal cancer screening, and recommended immunizations.    She notes some chronic mild fatigue. Denies focal symptoms such as chest pain, palpitations, change in bowel habits. She is very active on a daily basis.   Outpatient Encounter Prescriptions as of 11/10/2012  Medication Sig Dispense Refill  . atorvastatin (LIPITOR) 40 MG tablet Take 1 tablet (40 mg total) by mouth daily.  90 tablet  3  . clonazePAM (KLONOPIN) 0.5 MG tablet Take 1 tablet (0.5 mg total) by mouth 3 (three) times daily as needed.  90 tablet  3  . Cyanocobalamin (VITAMIN B-12 SL) Place 2 sprays under the tongue 3 (three) times daily.      Marland Kitchen FLUoxetine (PROZAC) 40 MG capsule Take 1 capsule (40 mg total) by mouth daily.  90 capsule  3  . metoprolol succinate (TOPROL-XL) 50 MG 24 hr tablet Take 1 tablet (50 mg total) by mouth daily. Take with or immediately following a meal.  30 tablet  6  . Multiple Vitamins-Minerals (CENTRUM SILVER PO) Take by mouth daily.      . Probiotic Product (ALIGN PO)  Take by mouth daily.      . ramipril (ALTACE) 5 MG tablet Take 5 mg by mouth daily.      Marland Kitchen warfarin (COUMADIN) 3 MG tablet TAKE 1 TABLET BY MOUTH EVERY MONDAY, TUESDAY, THURSDAY, FRIDAY, SATURDAY AND SUNDAY AND 1 1/2 TABLETS ON WEDNESDAY  90 tablet  0   No facility-administered encounter medications on file as of 11/10/2012.   BP 132/62  Pulse 60  Temp(Src) 98.2 F (36.8 C) (Oral)  Ht 5\' 2"  (1.575 m)  Wt 117 lb (53.071 kg)  BMI 21.39  kg/m2  SpO2 96%   Review of Systems  Constitutional: Negative for fever, chills, appetite change, fatigue and unexpected weight change.  HENT: Negative for ear pain, congestion, sore throat, trouble swallowing, neck pain, voice change and sinus pressure.   Eyes: Negative for visual disturbance.  Respiratory: Negative for cough, shortness of breath, wheezing and stridor.   Cardiovascular: Negative for chest pain, palpitations and leg swelling.  Gastrointestinal: Negative for nausea, vomiting, abdominal pain, diarrhea, constipation, blood in stool, abdominal distention and anal bleeding.  Genitourinary: Negative for dysuria and flank pain.  Musculoskeletal: Negative for myalgias, arthralgias and gait problem.  Skin: Negative for color change and rash.  Neurological: Negative for dizziness and headaches.  Hematological: Negative for adenopathy. Does not bruise/bleed easily.  Psychiatric/Behavioral: Negative for suicidal ideas, sleep disturbance and dysphoric mood. The patient is not nervous/anxious.        Objective:   Physical Exam  Constitutional: She is oriented to person, place, and time. She appears well-developed and well-nourished. No distress.  HENT:  Head: Normocephalic and atraumatic.  Right Ear: External ear normal.  Left Ear: External ear normal.  Nose: Nose normal.  Mouth/Throat: Oropharynx is clear and moist. No oropharyngeal exudate.  Eyes: Conjunctivae are normal. Pupils are equal, round, and reactive to light. Right eye exhibits no discharge. Left eye exhibits no discharge. No scleral icterus.  Neck: Normal range of motion. Neck supple. No tracheal deviation present. No thyromegaly present.  Cardiovascular: Normal rate, regular rhythm, normal heart sounds and intact distal pulses.  Exam reveals no gallop and no friction rub.   No murmur heard. Pulmonary/Chest: Effort normal and breath sounds normal. No accessory muscle usage. Not tachypneic. No respiratory distress. She  has no decreased breath sounds. She has no wheezes. She has no rales. She exhibits no tenderness. Right breast exhibits no inverted nipple, no mass, no nipple discharge, no skin change and no tenderness. Left breast exhibits no inverted nipple, no mass, no nipple discharge, no skin change and no tenderness. Breasts are symmetrical.    Abdominal: Soft. Bowel sounds are normal. She exhibits no distension and no mass. There is no tenderness. There is no rebound and no guarding.  Musculoskeletal: Normal range of motion. She exhibits no edema and no tenderness.  Lymphadenopathy:    She has no cervical adenopathy.  Neurological: She is alert and oriented to person, place, and time. No cranial nerve deficit. She exhibits normal muscle tone. Coordination normal.  Skin: Skin is warm and dry. No rash noted. She is not diaphoretic. No erythema. No pallor.  Psychiatric: She has a normal mood and affect. Her behavior is normal. Judgment and thought content normal.          Assessment & Plan:

## 2012-11-13 ENCOUNTER — Encounter: Payer: Self-pay | Admitting: *Deleted

## 2012-11-14 ENCOUNTER — Ambulatory Visit: Payer: Medicare Other | Admitting: Cardiovascular Disease

## 2012-12-06 ENCOUNTER — Telehealth: Payer: Self-pay | Admitting: Cardiovascular Disease

## 2012-12-06 NOTE — Telephone Encounter (Signed)
The patient  had a mechanical fall in [place] over the weekend and fractured her hip, she underwent hip surgery on Monday and will likely go to rehab in few days. I spoke with her daughter, Moira Umholtz to get an update. The patient will be attending rehab in [place]. I told her that we could manage her INR remotely until she is back in town. The daughter will notify us if that is needed at time of discharge.  Daughters number is [phone]

## 2012-12-11 ENCOUNTER — Other Ambulatory Visit: Payer: Self-pay | Admitting: Internal Medicine

## 2012-12-11 NOTE — Telephone Encounter (Signed)
Eprescribed.

## 2012-12-18 ENCOUNTER — Telehealth: Payer: Self-pay | Admitting: *Deleted

## 2012-12-18 ENCOUNTER — Other Ambulatory Visit: Payer: Self-pay | Admitting: Internal Medicine

## 2012-12-18 NOTE — Telephone Encounter (Signed)
Robin [name] from [facility] left a message on voicemail stating patient is being discharged from rehab today or tomorrow and they would like to know if you would be ok with signing/following the orders. She is being discharged with orders for nursing, PT and OT.

## 2012-12-18 NOTE — Telephone Encounter (Signed)
That is fine 

## 2012-12-19 NOTE — Telephone Encounter (Signed)
Called and spoke with Larita Fife, she verbally agreed.

## 2012-12-22 LAB — PROTIME-INR

## 2012-12-26 ENCOUNTER — Ambulatory Visit (INDEPENDENT_AMBULATORY_CARE_PROVIDER_SITE_OTHER): Payer: Medicare Other | Admitting: Adult Health

## 2012-12-26 ENCOUNTER — Ambulatory Visit (INDEPENDENT_AMBULATORY_CARE_PROVIDER_SITE_OTHER): Payer: Medicare Other | Admitting: Cardiovascular Disease

## 2012-12-26 ENCOUNTER — Encounter: Payer: Self-pay | Admitting: Cardiovascular Disease

## 2012-12-26 ENCOUNTER — Encounter: Payer: Self-pay | Admitting: Adult Health

## 2012-12-26 VITALS — BP 96/59 | HR 60 | Ht 62.0 in | Wt 113.8 lb

## 2012-12-26 VITALS — BP 106/52 | HR 63 | Temp 97.5°F | Resp 12 | Wt 115.5 lb

## 2012-12-26 DIAGNOSIS — I350 Nonrheumatic aortic (valve) stenosis: Secondary | ICD-10-CM

## 2012-12-26 DIAGNOSIS — Z7901 Long term (current) use of anticoagulants: Secondary | ICD-10-CM

## 2012-12-26 DIAGNOSIS — M25559 Pain in unspecified hip: Secondary | ICD-10-CM | POA: Insufficient documentation

## 2012-12-26 DIAGNOSIS — Z9889 Other specified postprocedural states: Secondary | ICD-10-CM

## 2012-12-26 DIAGNOSIS — I359 Nonrheumatic aortic valve disorder, unspecified: Secondary | ICD-10-CM

## 2012-12-26 DIAGNOSIS — Z79899 Other long term (current) drug therapy: Secondary | ICD-10-CM | POA: Insufficient documentation

## 2012-12-26 DIAGNOSIS — I4891 Unspecified atrial fibrillation: Secondary | ICD-10-CM

## 2012-12-26 DIAGNOSIS — Z8781 Personal history of (healed) traumatic fracture: Secondary | ICD-10-CM

## 2012-12-26 DIAGNOSIS — I2510 Atherosclerotic heart disease of native coronary artery without angina pectoris: Secondary | ICD-10-CM

## 2012-12-26 DIAGNOSIS — M25551 Pain in right hip: Secondary | ICD-10-CM

## 2012-12-26 DIAGNOSIS — I509 Heart failure, unspecified: Secondary | ICD-10-CM

## 2012-12-26 LAB — CBC WITH DIFFERENTIAL/PLATELET
Basophils Absolute: 0.1 10*3/uL (ref 0.0–0.1)
Basophils Relative: 0.8 % (ref 0.0–3.0)
Eosinophils Absolute: 0.3 10*3/uL (ref 0.0–0.7)
Eosinophils Relative: 3.8 % (ref 0.0–5.0)
HCT: 31.9 % — ABNORMAL LOW (ref 36.0–46.0)
Lymphocytes Relative: 13.7 % (ref 12.0–46.0)
MCV: 95.8 fl (ref 78.0–100.0)
Monocytes Relative: 10.8 % (ref 3.0–12.0)
Neutrophils Relative %: 70.9 % (ref 43.0–77.0)
Platelets: 319 10*3/uL (ref 150.0–400.0)
RBC: 3.33 Mil/uL — ABNORMAL LOW (ref 3.87–5.11)
WBC: 8.6 10*3/uL (ref 4.5–10.5)

## 2012-12-26 LAB — BASIC METABOLIC PANEL
Calcium: 9.7 mg/dL (ref 8.4–10.5)
Chloride: 96 mEq/L (ref 96–112)
Creatinine, Ser: 1.2 mg/dL (ref 0.4–1.2)
GFR: 46.1 mL/min — ABNORMAL LOW (ref >60.00)

## 2012-12-26 LAB — PROTIME-INR
INR: 2.5 ratio — ABNORMAL HIGH (ref 0.8–1.0)
Prothrombin Time: 26.1 s — ABNORMAL HIGH (ref 10.2–12.4)

## 2012-12-26 MED ORDER — METOPROLOL SUCCINATE ER 50 MG PO TB24: 50.0000 mg | ORAL_TABLET | Freq: Every day | ORAL | Status: DC

## 2012-12-26 MED ORDER — FENTANYL 12 MCG/HR TD PT72: 1.0000 | MEDICATED_PATCH | TRANSDERMAL | Status: DC

## 2012-12-26 NOTE — Progress Notes (Signed)
  Subjective:    Patient ID: Robin [name], female    DOB: 1928-09-20, 79 y.o.   MRN: [id number]  HPI  Patient is a pleasant 79 year old female who presents to clinic status post hospitalization for right hip fracture. Patient is status post right ORIF at [facility] in [place], [place]. From there [facility]. Patient was discharged home on 12/18/2012. I do not have any discharge information from this hospitalization at the present time. She presents to clinic with her daughter. Daughter is concerned that the patient was discharged from rehabilitation to quickly. Patient has stairs in her home and has been having trouble getting up stairs. In addition she is still quite unsteady. Her husband is elderly and they are concerned that her care is too much for him to handle. Patient currently has [facility] services for PT and nursing. They are inquiring if patient would be able to have someone stay with her for approximately 4-6 hours on a daily basis for approximately 2 weeks.  Patient was discharged on pain medication including oxycodone 5 mg every 4 hours as needed and tramadol 25 mg 3 times a day as needed. Patient reports that her pain is worse during the night. The pain is waking her from sleep. Daughter is inquiring if there is something else that she would be able to take that would help her get a more restful night. Patient takes oxycodone 5 mg around 10 PM and then she is up by 2 AM secondary to pain. She rates her pain currently a 5/10. She reports that pain during the night is around an 8/10.  Patient was also discharged on multiple medications including multiple vitamins. Daughter is requesting that medications be reviewed to see if something can be eliminated or condensed since she is taking so many medications.   Pain mainly at bedtime Needs Bedside commode Assistance during the day - discharged too soon from rehab  Review  of Systems  Respiratory: Negative.   Cardiovascular: Negative.   Gastrointestinal: Negative.   Genitourinary: Negative.   Musculoskeletal: Positive for gait problem.       Right hip pain secondary to recent ORIF  Skin:       Incision on the right hip. Family is requesting a referral to a local orthopedic for followup. Patient had surgery in [place]  Neurological: Positive for weakness.  Psychiatric/Behavioral: Positive for sleep disturbance.       Sleep disturbance secondary to pain       Objective:   Physical Exam  Constitutional: She is oriented to person, place, and time. She appears well-developed and well-nourished. No distress.  Cardiovascular: Normal rate and regular rhythm.  Exam reveals no gallop.   Murmur heard. Systolic murmur  Pulmonary/Chest: Effort normal and breath sounds normal. No respiratory distress. She has no wheezes. She has no rales.  Abdominal: Soft. Bowel sounds are normal.  Musculoskeletal:  Patient is able to get up from a sitting position to a standing position; however, she is unsteady.  Neurological: She is alert and oriented to person, place, and time.  Skin: Skin is warm and dry.  Incision of right hip well approximated. No erythema noted. No signs of infection.  Psychiatric: She has a normal mood and affect. Her behavior is normal. Judgment and thought content normal.          Assessment & Plan:

## 2012-12-26 NOTE — Assessment & Plan Note (Signed)
In NSR. Change Metoprolol Tartrate back to Succinate and decrease dose to 50 mg daily due to low BP.

## 2012-12-26 NOTE — Assessment & Plan Note (Signed)
She appears to be euvolemic. Hold Ramipril for now due to low BP associated with dizziness. Anemia is likely contributing. Will likely resume in few months.

## 2012-12-26 NOTE — Patient Instructions (Addendum)
Stop Ramipril  Continue other medications.   Follow up in 2 months.

## 2012-12-26 NOTE — Assessment & Plan Note (Addendum)
Patient was discharged home on multiple pain medications including oxycodone 5 mg every 4 hours when necessary, tramadol 25 mg 3 times a day as needed. Patient is in a considerable amount of pain (8/10) especially during the night. This is most likely following daily activity of walking up stairs etc. the pain is waking patient up during the night. Daughter is inquiring if they could give her 2 oxycodone at bedtime but I am concerned that, with the patient's instability, she will get up during the night to use the restroom and fall. Discussed using a small dose of fentanyl (12 mcg) every 3 days. This will provide a steady state of the medication and a avoid the peaks and valleys associated with short acting pain medication. Discussed that this medication will take approximately 18 hours for full effect. At that point, we will use the oxycodone only for breakthrough pain. This medication will also cause some sedation this is why I am using the smallest dose possible. Instructed not to get under a hot shower with this patch as this will potentiate the medication. We also discussed trying the oxycodone and a tylenol to see if the relief lasts longer. All these medications have a potential for abuse. They are only intended for a short period of time. Daughter verbalized understanding. We will also get patient a bedside commode. Check cbc, met b and pt/inr. Note, greater than 60 minutes were spent in face to face communication with patient pertaining to problems listed, in the assessment, planning and implementation of care pertaining to these problems.

## 2012-12-26 NOTE — Assessment & Plan Note (Signed)
All medications reviewed. PT/INR drawn in clinic. Patient may hold multivitamin, vit E and vit A for now. She can resume these when she is not taking so many medications. Continue with calcium, Vit D, iron, stool softener and laxative since she is on narcotics. Continue all prescription meds.

## 2012-12-26 NOTE — Assessment & Plan Note (Signed)
Right hip fracture following a fall. Surgery in [place]. Refer to local ortho for follow up.

## 2012-12-26 NOTE — Assessment & Plan Note (Signed)
Moderate by most recent echo. Continue to monitor.    

## 2012-12-26 NOTE — Progress Notes (Signed)
HPI  Robin [name] is a pleasant 79 year old female who is here today for a followup visit. In 2008 she presented with non-ST elevation myocardial infarction, heart failure with severely reduced LV systolic function (EF10-15%) as well as atrial fibrillation. Cardiac catheterization at that time showed severe three-vessel coronary artery disease. She underwent CABG at [facility] by Dr. Earlene Plater as well as Maze procedure. She had heart block that required dual-chamber pacemaker placement. She has done well since then with medical therapy. Her ejection fraction gradually improved. In 2011 and it was 40%. Echo in 2013 showed improvement to 50-55% and most recently 45-50% with moderate mitral regurgitation and moderate aortic stenosis.  Marland Kitchen She was hospitalized briefly in September, 2013 at [facility] with atrial fibrillation with rapid ventricular response. She converted to normal sinus rhythm with diltiazem. The dose of Toprol was increased from 50 to100 mg once daily. She felt very tired after increasing the dose of Toprol and I decided to decrease the dose back to 50 mg once daily.  She recently went to [place] with husband to celebrate anniversary. Unfortunately, she fell there and broke right hip. She underwent surgical repair. Post op was complicated by hypoxia and pneumonia which was treated. No cardiac complications. She was discharged to rehab for 2 weeks. She is back home with support of family. She is walking with a walker and get PT/OT at home. She feels dizzy. BP has been running low. Still with significant hip pain.    Allergies  Allergen Reactions  . Amoxicillin   . Codeine     Nausea/vomiting/diarrhea     Current Outpatient Prescriptions on File Prior to Visit  Medication Sig Dispense Refill  . acetaminophen (TYLENOL) 325 MG tablet Take 650 mg by mouth 3 (three) times daily.      Marland Kitchen atorvastatin (LIPITOR) 40 MG tablet Take 1 tablet (40 mg total) by mouth daily.  90 tablet  3  . Calcium  Citrate-Vitamin D (CALCIUM CITRATE + PO) Take 1,900 mg by mouth 2 (two) times daily.      . cholecalciferol (VITAMIN D) 1000 UNITS tablet Take 4,000 Units by mouth daily.      . ferrous sulfate 325 (65 FE) MG tablet Take 325 mg by mouth 2 (two) times daily.      Marland Kitchen FLUoxetine (PROZAC) 40 MG capsule TAKE 1 CAPSULE BY MOUTH EVERY DAY  90 capsule  0  . Fluticasone-Salmeterol (ADVAIR) 250-50 MCG/DOSE AEPB Inhale 1 puff into the lungs 2 (two) times daily.      . furosemide (LASIX) 20 MG tablet Take 10 mg by mouth daily.      . metoprolol succinate (TOPROL-XL) 50 MG 24 hr tablet Take 1 tablet (50 mg total) by mouth daily. Take with or immediately following a meal.  30 tablet  6  . oxycodone (OXY-IR) 5 MG capsule Take 5 mg by mouth every 4 (four) hours as needed.      . polyethylene glycol (MIRALAX / GLYCOLAX) packet Take 17 g by mouth daily as needed.      . ramipril (ALTACE) 5 MG tablet Take 5 mg by mouth daily.      . traMADol (ULTRAM) 50 MG tablet Take 25 mg by mouth 3 (three) times daily as needed for pain.      Marland Kitchen warfarin (COUMADIN) 3 MG tablet TAKE 1 TABLET BY MOUTH ON MONDAY, TUESDAY, THURSDAY, FRIDAY, SATURDAY, SUNDAY AND 1 1/2 TABLETS ON WEDNESDAY  90 tablet  1  . fentaNYL (DURAGESIC - DOSED  MCG/HR) 12 MCG/HR Place 1 patch (12.5 mcg total) onto the skin every 3 (three) days.  5 patch  0   No current facility-administered medications on file prior to visit.     Past Medical History  Diagnosis Date  . Hypertension   . Abdominal aortic aneurysm     4.2 cm followed by Dr. Evette Cristal  . Hyperlipidemia   . CAD (coronary artery disease)     Severe 3 vessel CAD with severe ischemic cardiomyopathy in 12/2006. s/p CABG at [facility] by Dr. Earlene Plater: LIMA to LAD, SVG to OM1, SVG to [facility] and SVG to PDA. cardiac cath 06/11 : patent grafts.   . CHF (congestive heart failure)     w/ejection fraction 10% to 15%  . Atrial fibrillation   . Left bundle branch block   . Sick sinus syndrome     s/p dual chamber  pacemaker 01/2007 post CABG and Maze procedure.   . Mitral regurgitation     Moderate to severe by echo but mild by intraoperative TEE in 2008  . Aortic stenosis     Mild to moderate with mild to moderate AI.   Marland Kitchen Carotid artery occlusion     Moderate bilateral with right carotid bruit  . Diffuse cystic mastopathy      Past Surgical History  Procedure Laterality Date  . Insert / replace / remove pacemaker  2008    @ [facility]  . Bilateral maze procedure    . Coronary bypass graft stenosis  01/27/07    @ [facility];Dr. Earlene Plater  . Total vaginal hysterectomy  1998  . Cholecystectomy    . Appendectomy    . Breast lumpectomy      left  . Cardiac catheterization  2008    LAD 80% MID, LCX 100%MID, RCA 70%  . Coronary artery bypass graft  2008    not a redo candidate due to calcified aorta  . Abdominal hysterectomy  1997  . Eye surgery  2007  . Hip fracture surgery Right      Family History  Problem Relation Age of Onset  . Heart disease Mother      History   Social History  . Marital Status: Married    Spouse Name: N/A    Number of Children: N/A  . Years of Education: N/A   Occupational History  . Not on file.   Social History Main Topics  . Smoking status: Former Smoker -- 0.25 packs/day for 50 years    Types: Cigarettes    Quit date: 08/01/2006  . Smokeless tobacco: Not on file  . Alcohol Use: 0.6 oz/week    1 Glasses of wine per week     Comment: occasional  . Drug Use: No  . Sexual Activity:    Other Topics Concern  . Not on file   Social History Narrative   Lives with husband and youngest daughter. Has 5 children.      Work - retired in 2000 from [employer]           PHYSICAL EXAM   BP 96/59  Pulse 60  Ht 5\' 2"  (1.575 m)  Wt 113 lb 12 oz (51.597 kg)  BMI 20.8 kg/m2 Constitutional: She is oriented to person, place, and time. She appears well-developed and well-nourished. No distress.  HENT: No nasal discharge.  Head: Normocephalic and atraumatic.   Eyes: Pupils are equal and round. Right eye exhibits no discharge. Left eye exhibits no discharge.  Neck: Normal range of motion. Neck  supple. No JVD present. No thyromegaly present. Right carotid bruit. Cardiovascular: Normal rate, regular rhythm, normal heart sounds. Exam reveals no gallop and no friction rub. 3/6 systolic ejection murmur at the aortic area which is mid peaking with preserved S2. Pulmonary/Chest: Effort normal and breath sounds normal. No stridor. No respiratory distress. She has no wheezes. She has no rales. She exhibits no tenderness.  Abdominal: Soft. Bowel sounds are normal. She exhibits no distension. There is no tenderness. There is no rebound and no guarding.  Musculoskeletal: Normal range of motion. She exhibits no edema and no tenderness.  Neurological: She is alert and oriented to person, place, and time. Coordination normal.  Skin: Skin is warm and dry. No rash noted. She is not diaphoretic. No erythema. No pallor.  Psychiatric: She has a normal mood and affect. Her behavior is normal. Judgment and thought content normal.     EKG: Sinus  Rhythm  -First degree A-V block  PRi = 234 -ventricular paced rhythm  ABNORMAL    ASSESSMENT AND PLAN

## 2012-12-26 NOTE — Assessment & Plan Note (Signed)
Stable with no angina. Continue medical therapy.

## 2012-12-27 ENCOUNTER — Telehealth: Payer: Self-pay | Admitting: Emergency Medicine

## 2012-12-27 NOTE — Telephone Encounter (Signed)
Left message giving a verbal ok and will fax orders over

## 2012-12-27 NOTE — Telephone Encounter (Signed)
Robin [name] called wanting to make sure it was ok to continue OT for safety and health care. Please advise

## 2013-01-01 ENCOUNTER — Telehealth: Payer: Self-pay | Admitting: *Deleted

## 2013-01-01 ENCOUNTER — Other Ambulatory Visit (INDEPENDENT_AMBULATORY_CARE_PROVIDER_SITE_OTHER): Payer: Medicare Other

## 2013-01-01 DIAGNOSIS — E871 Hypo-osmolality and hyponatremia: Secondary | ICD-10-CM

## 2013-01-01 LAB — BASIC METABOLIC PANEL
BUN: 14 mg/dL (ref 6–23)
Calcium: 9.3 mg/dL (ref 8.4–10.5)
Creatinine, Ser: 1 mg/dL (ref 0.4–1.2)
GFR: 57 mL/min — ABNORMAL LOW (ref >60.00)

## 2013-01-01 NOTE — Telephone Encounter (Signed)
bmet - hyponatremia

## 2013-01-01 NOTE — Telephone Encounter (Signed)
Repeat BMP, hyponatremia

## 2013-01-01 NOTE — Telephone Encounter (Signed)
What labs and dx?  

## 2013-01-04 ENCOUNTER — Other Ambulatory Visit: Payer: Self-pay | Admitting: Internal Medicine

## 2013-01-09 ENCOUNTER — Telehealth: Payer: Self-pay | Admitting: Internal Medicine

## 2013-01-09 ENCOUNTER — Encounter: Payer: Self-pay | Admitting: *Deleted

## 2013-01-09 NOTE — Telephone Encounter (Signed)
Needing [facility] to come by to pick up her oxygen tank. They are needing an order to discontinue her oxygen. customer   # [phone] Fax #  [phone]

## 2013-01-09 NOTE — Telephone Encounter (Signed)
That is fine 

## 2013-01-09 NOTE — Telephone Encounter (Signed)
Fwd to Dr. Walker 

## 2013-01-09 NOTE — Telephone Encounter (Signed)
Information faxed per patient request.

## 2013-01-17 NOTE — Telephone Encounter (Signed)
Pt called and saying she is needing authorization for home oxygen. Please call patient as soon as possible. She gets it through [facility].

## 2013-01-17 NOTE — Telephone Encounter (Signed)
Pt's husband came by after I spoke with wife and wanted [place] to resend info over about oxygen ??

## 2013-01-18 NOTE — Telephone Encounter (Signed)
Need for them to come to pick up the Oxygen. Form refaxed to [facility], she would like this gone because she is afraid she will be charged for something she does not need. Letter was faxed on 01/09/13 and faxed again today.

## 2013-01-29 ENCOUNTER — Encounter: Payer: Self-pay | Admitting: Cardiovascular Disease

## 2013-01-29 ENCOUNTER — Ambulatory Visit (INDEPENDENT_AMBULATORY_CARE_PROVIDER_SITE_OTHER): Payer: Medicare Other | Admitting: Cardiovascular Disease

## 2013-01-29 VITALS — BP 108/60 | HR 87 | Ht 63.0 in | Wt 106.0 lb

## 2013-01-29 DIAGNOSIS — I2510 Atherosclerotic heart disease of native coronary artery without angina pectoris: Secondary | ICD-10-CM

## 2013-01-29 DIAGNOSIS — I509 Heart failure, unspecified: Secondary | ICD-10-CM

## 2013-01-29 DIAGNOSIS — I4891 Unspecified atrial fibrillation: Secondary | ICD-10-CM

## 2013-01-29 MED ORDER — METOPROLOL SUCCINATE ER 25 MG PO TB24: 25.0000 mg | ORAL_TABLET | Freq: Every day | ORAL | Status: DC

## 2013-01-29 NOTE — Assessment & Plan Note (Signed)
She appears to be euvolemic. Continue to hold Ramipril for now due to low BP .  Will likely resume in the near future.

## 2013-01-29 NOTE — Assessment & Plan Note (Signed)
She is today in wandering Atrial  Rhythm with no evidence of atrial fibrillation. Recent symptoms of palpitations and increased resting heart rate are likely due to beta blocker withdrawal. I will go ahead and resume treatment with Toprol 25 mg once daily. If she continues to be symptomatic, I will increase the dose to 50 mg once daily which was her  original dose. Continue long-term anticoagulation with warfarin

## 2013-01-29 NOTE — Assessment & Plan Note (Signed)
She has no symptoms suggestive of angina. Continue medical therapy. 

## 2013-01-29 NOTE — Progress Notes (Signed)
HPI  Robin [name] is a pleasant 79 year old female who is here today for a followup visit. In 2008 she presented with non-ST elevation myocardial infarction, heart failure with severely reduced LV systolic function (EF10-15%) as well as atrial fibrillation. Cardiac catheterization at that time showed severe three-vessel coronary artery disease. She underwent CABG at [facility] by Dr. Earlene Plater as well as Maze procedure. She had heart block that required dual-chamber pacemaker placement. She has done well since then with medical therapy. Her ejection fraction gradually improved.  Echo in 2013 showed improvement to 50-55% and most recently 45-50% with moderate mitral regurgitation and moderate aortic stenosis.  Marland Kitchen She was hospitalized briefly in September, 2013 at [facility] with atrial fibrillation with rapid ventricular response. She converted to normal sinus rhythm with diltiazem. The dose of Toprol was increased from 50 to100 mg once daily. She felt very tired after increasing the dose of Toprol and I decided to decrease the dose back to 50 mg once daily.  She  went to [facility] in October with husband to celebrate anniversary. Unfortunately, she fell there and broke right hip. She underwent surgical repair. Post op was complicated by hypoxia and pneumonia which was treated. No cardiac complications. During last visit, she was noted to be hypotensive. Thus, I stopped Ramipril. I switched metoprolol tartrate the metoprolol succinate 50 mg once daily. For unclear reasons, the patient stopped taking metoprolol. Over the last few days, she has noticed increased resting heart rate associated with increased dyspnea and feeling jittery. She is accompanied today by her son who is visiting from [place]. She has made significant progress with physical therapy.   Allergies  Allergen Reactions  . Amoxicillin   . Codeine     Nausea/vomiting/diarrhea     Current Outpatient Prescriptions on File Prior to Visit  Medication Sig  Dispense Refill  . acetaminophen (TYLENOL) 325 MG tablet Take 650 mg by mouth as needed.       Marland Kitchen atorvastatin (LIPITOR) 40 MG tablet TAKE 1 TABLET BY MOUTH EVERY DAY  90 tablet  1  . Calcium Citrate-Vitamin D (CALCIUM CITRATE + PO) Take 1,900 mg by mouth 2 (two) times daily.      . cholecalciferol (VITAMIN D) 1000 UNITS tablet Take 1,000 Units by mouth daily.       Marland Kitchen FLUoxetine (PROZAC) 40 MG capsule TAKE 1 CAPSULE BY MOUTH EVERY DAY  90 capsule  0  . Probiotic Product (ALIGN) 4 MG CAPS Take by mouth daily.      . traMADol (ULTRAM) 50 MG tablet Take 25 mg by mouth 3 (three) times daily as needed for pain.      Marland Kitchen warfarin (COUMADIN) 3 MG tablet TAKE 1 TABLET BY MOUTH ON MONDAY, TUESDAY, THURSDAY, FRIDAY, SATURDAY, SUNDAY AND 1 1/2 TABLETS ON WEDNESDAY  90 tablet  1   No current facility-administered medications on file prior to visit.     Past Medical History  Diagnosis Date  . Hypertension   . Abdominal aortic aneurysm     4.2 cm followed by Dr. Evette Cristal  . Hyperlipidemia   . CAD (coronary artery disease)     Severe 3 vessel CAD with severe ischemic cardiomyopathy in 12/2006. s/p CABG at [facility] by Dr. Earlene Plater: LIMA to LAD, SVG to OM1, SVG to [facility] and SVG to PDA. cardiac cath 06/11 : patent grafts.   . CHF (congestive heart failure)     w/ejection fraction 10% to 15%  . Atrial fibrillation   . Left bundle branch  block   . Sick sinus syndrome     s/p dual chamber pacemaker 01/2007 post CABG and Maze procedure.   . Mitral regurgitation     Moderate to severe by echo but mild by intraoperative TEE in 2008  . Aortic stenosis     Mild to moderate with mild to moderate AI.   Marland Kitchen Carotid artery occlusion     Moderate bilateral with right carotid bruit  . Diffuse cystic mastopathy      Past Surgical History  Procedure Laterality Date  . Insert / replace / remove pacemaker  2008    @ [facility]  . Bilateral maze procedure    . Coronary bypass graft stenosis  01/27/07    @ [facility];Dr. Earlene Plater  . Total  vaginal hysterectomy  1998  . Cholecystectomy    . Appendectomy    . Breast lumpectomy      left  . Cardiac catheterization  2008    LAD 80% MID, LCX 100%MID, RCA 70%  . Coronary artery bypass graft  2008    not a redo candidate due to calcified aorta  . Abdominal hysterectomy  1997  . Eye surgery  2007  . Hip fracture surgery Right      Family History  Problem Relation Age of Onset  . Heart disease Mother      History   Social History  . Marital Status: Married    Spouse Name: N/A    Number of Children: N/A  . Years of Education: N/A   Occupational History  . Not on file.   Social History Main Topics  . Smoking status: Former Smoker -- 0.25 packs/day for 50 years    Types: Cigarettes    Quit date: 08/01/2006  . Smokeless tobacco: Not on file  . Alcohol Use: No     Comment: occasional  . Drug Use: No  . Sexual Activity: Not on file   Other Topics Concern  . Not on file   Social History Narrative   Lives with husband and youngest daughter. Has 5 children.      Work - retired in 2000 from [employer]           PHYSICAL EXAM   BP 108/60  Pulse 87  Ht 5\' 3"  (1.6 m)  Wt 106 lb (48.081 kg)  BMI 18.78 kg/m2 Constitutional: She is oriented to person, place, and time. She appears well-developed and well-nourished. No distress.  HENT: No nasal discharge.  Head: Normocephalic and atraumatic.  Eyes: Pupils are equal and round. Right eye exhibits no discharge. Left eye exhibits no discharge.  Neck: Normal range of motion. Neck supple. No JVD present. No thyromegaly present. Right carotid bruit. Cardiovascular: Normal rate, regular rhythm, normal heart sounds. Exam reveals no gallop and no friction rub. 3/6 systolic ejection murmur at the aortic area which is mid peaking with preserved S2. Pulmonary/Chest: Effort normal and breath sounds normal. No stridor. No respiratory distress. She has no wheezes. She has no rales. She exhibits no tenderness.  Abdominal:  Soft. Bowel sounds are normal. She exhibits no distension. There is no tenderness. There is no rebound and no guarding.  Musculoskeletal: Normal range of motion. She exhibits no edema and no tenderness.  Neurological: She is alert and oriented to person, place, and time. Coordination normal.  Skin: Skin is warm and dry. No rash noted. She is not diaphoretic. No erythema. No pallor.  Psychiatric: She has a normal mood and affect. Her behavior is normal. Judgment and  thought content normal.     EKG: Wandering Atrial  Rhythm  -First degree A-V block  P:QRS - 1:1, Abnormal P axis, H Rate 87  PRi = 392 -Left bundle branch block and right axis.   ABNORMAL    ASSESSMENT AND PLAN

## 2013-01-29 NOTE — Patient Instructions (Signed)
Start Metoprolol Succinate 25 mg once daily. Continue to monitor heart rate and blood pressure. If heart rate continues to be elevated, call us in 3 days to increase the dose.   Follow up in 2 months.

## 2013-01-30 ENCOUNTER — Encounter: Payer: Self-pay | Admitting: Internal Medicine

## 2013-02-07 ENCOUNTER — Ambulatory Visit (INDEPENDENT_AMBULATORY_CARE_PROVIDER_SITE_OTHER): Payer: Medicare Other | Admitting: General Practice

## 2013-02-07 DIAGNOSIS — I4891 Unspecified atrial fibrillation: Secondary | ICD-10-CM

## 2013-02-07 DIAGNOSIS — Z7901 Long term (current) use of anticoagulants: Secondary | ICD-10-CM

## 2013-02-09 ENCOUNTER — Ambulatory Visit: Payer: Medicare Other | Admitting: Internal Medicine

## 2013-02-12 ENCOUNTER — Encounter: Payer: Self-pay | Admitting: *Deleted

## 2013-02-13 ENCOUNTER — Ambulatory Visit: Payer: Medicare Other | Admitting: Internal Medicine

## 2013-03-06 ENCOUNTER — Ambulatory Visit: Payer: Medicare Other | Admitting: Cardiovascular Disease

## 2013-03-07 ENCOUNTER — Ambulatory Visit (INDEPENDENT_AMBULATORY_CARE_PROVIDER_SITE_OTHER): Payer: Medicare Other

## 2013-03-07 DIAGNOSIS — I4891 Unspecified atrial fibrillation: Secondary | ICD-10-CM

## 2013-03-07 DIAGNOSIS — Z5181 Encounter for therapeutic drug level monitoring: Secondary | ICD-10-CM

## 2013-03-07 DIAGNOSIS — Z7901 Long term (current) use of anticoagulants: Secondary | ICD-10-CM

## 2013-03-07 LAB — POCT INR: INR: 2

## 2013-03-23 ENCOUNTER — Other Ambulatory Visit: Payer: Self-pay | Admitting: Internal Medicine

## 2013-04-02 ENCOUNTER — Ambulatory Visit: Payer: Medicare Other | Admitting: Cardiovascular Disease

## 2013-04-04 ENCOUNTER — Ambulatory Visit (INDEPENDENT_AMBULATORY_CARE_PROVIDER_SITE_OTHER): Payer: Medicare Other | Admitting: *Deleted

## 2013-04-04 DIAGNOSIS — Z5181 Encounter for therapeutic drug level monitoring: Secondary | ICD-10-CM | POA: Insufficient documentation

## 2013-04-04 DIAGNOSIS — I4891 Unspecified atrial fibrillation: Secondary | ICD-10-CM

## 2013-04-04 DIAGNOSIS — Z7901 Long term (current) use of anticoagulants: Secondary | ICD-10-CM

## 2013-04-04 LAB — POCT INR: INR: 2

## 2013-04-09 ENCOUNTER — Ambulatory Visit (INDEPENDENT_AMBULATORY_CARE_PROVIDER_SITE_OTHER): Payer: Medicare Other | Admitting: *Deleted

## 2013-04-09 DIAGNOSIS — I495 Sick sinus syndrome: Secondary | ICD-10-CM

## 2013-04-09 LAB — MDC_IDC_ENUM_SESS_TYPE_INCLINIC
Battery Remaining Longevity: 29 mo
Brady Statistic RA Percent Paced: 98 %
Brady Statistic RV Percent Paced: 5 %
Date Time Interrogation Session: 20150209050000
Implantable Pulse Generator Serial Number: 574689
Lead Channel Pacing Threshold Amplitude: 0.6 V
Lead Channel Pacing Threshold Amplitude: 1.4 V
Lead Channel Pacing Threshold Pulse Width: 0.5 ms
Lead Channel Pacing Threshold Pulse Width: 0.6 ms
Lead Channel Sensing Intrinsic Amplitude: 10.6 mV
Lead Channel Sensing Intrinsic Amplitude: 2 mV
Lead Channel Setting Sensing Sensitivity: 2.5 mV
MDC IDC MSMT LEADCHNL RA IMPEDANCE VALUE: 540 Ohm
MDC IDC MSMT LEADCHNL RV IMPEDANCE VALUE: 540 Ohm
MDC IDC MSMT LEADCHNL RV PACING THRESHOLD AMPLITUDE: 1.1 V
MDC IDC MSMT LEADCHNL RV PACING THRESHOLD PULSEWIDTH: 0.4 ms
MDC IDC SET LEADCHNL RA PACING AMPLITUDE: 2 V
MDC IDC SET LEADCHNL RV PACING AMPLITUDE: 2.4 V
MDC IDC SET LEADCHNL RV PACING PULSEWIDTH: 0.6 ms

## 2013-04-09 NOTE — Progress Notes (Signed)
PPM check in office. 

## 2013-04-10 ENCOUNTER — Other Ambulatory Visit: Payer: Self-pay | Admitting: Internal Medicine

## 2013-04-10 ENCOUNTER — Encounter: Payer: Self-pay | Admitting: Cardiovascular Disease

## 2013-04-10 ENCOUNTER — Ambulatory Visit (INDEPENDENT_AMBULATORY_CARE_PROVIDER_SITE_OTHER): Payer: Medicare Other | Admitting: Cardiovascular Disease

## 2013-04-10 VITALS — BP 130/62 | HR 60 | Ht 62.0 in | Wt 110.8 lb

## 2013-04-10 DIAGNOSIS — R0602 Shortness of breath: Secondary | ICD-10-CM

## 2013-04-10 DIAGNOSIS — I350 Nonrheumatic aortic (valve) stenosis: Secondary | ICD-10-CM

## 2013-04-10 DIAGNOSIS — E785 Hyperlipidemia, unspecified: Secondary | ICD-10-CM

## 2013-04-10 DIAGNOSIS — I2510 Atherosclerotic heart disease of native coronary artery without angina pectoris: Secondary | ICD-10-CM

## 2013-04-10 DIAGNOSIS — I359 Nonrheumatic aortic valve disorder, unspecified: Secondary | ICD-10-CM

## 2013-04-10 DIAGNOSIS — I4891 Unspecified atrial fibrillation: Secondary | ICD-10-CM

## 2013-04-10 MED ORDER — RAMIPRIL 5 MG PO CAPS: 5.0000 mg | ORAL_CAPSULE | Freq: Every day | ORAL | Status: DC

## 2013-04-10 NOTE — Patient Instructions (Signed)
Your physician has recommended you make the following change in your medication:  Resume Ramipril 5 mg daily    Your physician recommends that you schedule a follow-up appointment in:  4 months

## 2013-04-10 NOTE — Assessment & Plan Note (Signed)
Moderate by most recent echo. Continue to monitor.

## 2013-04-10 NOTE — Assessment & Plan Note (Signed)
She is doing reasonably well with no symptoms of angina. Continue medical therapy. She is no longer hypotensive. Resume Ramipril 5 mg once daily

## 2013-04-10 NOTE — Assessment & Plan Note (Signed)
Lab Results  Component Value Date   CHOL 170 11/10/2012   HDL 64.40 11/10/2012   LDLCALC 77 11/10/2012   TRIG 141.0 11/10/2012   CHOLHDL 3 11/10/2012   Continue treatment with atorvastatin.

## 2013-04-10 NOTE — Assessment & Plan Note (Signed)
Symptoms improved after resuming small dose Toprol. Continue long-term anticoagulation with warfarin.

## 2013-04-10 NOTE — Progress Notes (Signed)
HPI  [name] is a pleasant 80 year old female who is here today for a followup visit. In 2008 she presented with non-ST elevation myocardial infarction, heart failure with severely reduced LV systolic function (EF10-15%) as well as atrial fibrillation. Cardiac catheterization at that time showed severe three-vessel coronary artery disease. [facility] Earlene Plateravis as well as Maze procedure. She had heart block that required dual-chamber pacemaker placement. She has done well since then with medical therapy. Her ejection fraction gradually improved.  Echo in 2013 showed improvement to 50-55% and most recently 45-50% with moderate mitral regurgitation and moderate aortic stenosis.  Marland Kitchen. She was hospitalized briefly in September, 2013 at [facility] with atrial fibrillation with rapid ventricular response. The dose of Toprol was increased from 50 to100 mg once daily. She felt very tired after increasing the dose of Toprol and I decided to decrease the dose back to 50 mg once daily.  She  went to [facility] in October with husband to celebrate anniversary. Unfortunately, she fell there and broke right hip. She underwent surgical repair. Post op was complicated by hypoxia and pneumonia which was treated. No cardiac complications. She has been doing reasonably well. Palpitations and dyspnea improved after resuming small dose Toprol. I reviewed blood pressure readings at home. Blood pressure continues to run slightly high around 145 mm Hg.    Allergies  Allergen Reactions  . Amoxicillin   . Codeine     Nausea/vomiting/diarrhea     Current Outpatient Prescriptions on File Prior to Visit  Medication Sig Dispense Refill  . atorvastatin (LIPITOR) 40 MG tablet TAKE ONE TABLET BY MOUTH EVERY DAY  90 tablet  1  . Calcium Citrate-Vitamin D (CALCIUM CITRATE + PO) Take 1,900 mg by mouth 2 (two) times daily.      . cholecalciferol (VITAMIN D) 1000 UNITS tablet Take 1,000 Units by mouth daily.        Marland Kitchen. FLUoxetine (PROZAC) 40 MG capsule TAKE 1 CAPSULE BY MOUTH EVERY DAY  90 capsule  0  . metoprolol succinate (TOPROL XL) 25 MG 24 hr tablet Take 1 tablet (25 mg total) by mouth daily.  30 tablet  6  . Multiple Vitamin (MULTIVITAMIN) tablet Take 1 tablet by mouth daily.      . Probiotic Product (ALIGN) 4 MG CAPS Take by mouth daily.      . traMADol (ULTRAM) 50 MG tablet Take 25 mg by mouth 3 (three) times daily as needed for pain.       No current facility-administered medications on file prior to visit.     Past Medical History  Diagnosis Date  . Hypertension   . Abdominal aortic aneurysm     4.2 cm followed by Dr. Evette CristalSankar  . Hyperlipidemia   . CAD (coronary artery disease)     Severe 3 vessel CAD with severe ischemic cardiomyopathy in 12/2006. [facility] by Dr. Earlene Plateravis: LIMA to LAD, SVG to OM1, [facility] and SVG to PDA. cardiac cath 06/11 : patent grafts.   . CHF (congestive heart failure)     w/ejection fraction 10% to 15%  . Atrial fibrillation   . Left bundle branch block   . Sick sinus syndrome     s/p dual chamber pacemaker 01/2007 post CABG and Maze procedure.   . Mitral regurgitation     Moderate to severe by echo but mild by intraoperative TEE in 2008  . Aortic stenosis     Mild to  moderate with mild to moderate AI.   Marland Kitchen Carotid artery occlusion     Moderate bilateral with right carotid bruit  . Diffuse cystic mastopathy      Past Surgical History  Procedure Laterality Date  . Insert / replace / remove pacemaker  2008    @ [facility]  . Bilateral maze procedure    . Coronary bypass graft stenosis  01/27/07    @ [facility];Dr. Earlene Plater  . Total vaginal hysterectomy  1998  . Cholecystectomy    . Appendectomy    . Breast lumpectomy      left  . Cardiac catheterization  2008    LAD 80% MID, LCX 100%MID, RCA 70%  . Coronary artery bypass graft  2008    not a redo candidate due to calcified aorta  . Abdominal hysterectomy  1997  . Eye surgery  2007  . Hip fracture surgery  Right      Family History  Problem Relation Age of Onset  . Heart disease Mother      History   Social History  . Marital Status: Married    Spouse Name: N/A    Number of Children: N/A  . Years of Education: N/A   Occupational History  . Not on file.   Social History Main Topics  . Smoking status: Former Smoker -- 0.25 packs/day for 50 years    Types: Cigarettes    Quit date: 08/01/2006  . Smokeless tobacco: Not on file  . Alcohol Use: No     Comment: occasional  . Drug Use: No  . Sexual Activity: Not on file   Other Topics Concern  . Not on file   Social History Narrative   Lives with husband and youngest daughter. Has 5 children.      Work - retired in 2000 from [employer]           PHYSICAL EXAM   BP 130/62  Pulse 60  Ht 5\' 2"  (1.575 m)  Wt 110 lb 12 oz (50.236 kg)  BMI 20.25 kg/m2 Constitutional: She is oriented to person, place, and time. She appears well-developed and well-nourished. No distress.  HENT: No nasal discharge.  Head: Normocephalic and atraumatic.  Eyes: Pupils are equal and round. Right eye exhibits no discharge. Left eye exhibits no discharge.  Neck: Normal range of motion. Neck supple. No JVD present. No thyromegaly present. Right carotid bruit. Cardiovascular: Normal rate, regular rhythm, normal heart sounds. Exam reveals no gallop and no friction rub. 3/6 systolic ejection murmur at the aortic area which is mid peaking with preserved S2. Pulmonary/Chest: Effort normal and breath sounds normal. No stridor. No respiratory distress. She has no wheezes. She has no rales. She exhibits no tenderness.  Abdominal: Soft. Bowel sounds are normal. She exhibits no distension. There is no tenderness. There is no rebound and no guarding.  Musculoskeletal: Normal range of motion. She exhibits no edema and no tenderness.  Neurological: She is alert and oriented to person, place, and time. Coordination normal.  Skin: Skin is warm and dry. No  rash noted. She is not diaphoretic. No erythema. No pallor.  Psychiatric: She has a normal mood and affect. Her behavior is normal. Judgment and thought content normal.     EKG: Ventricular paced rhythm  ABNORMAL    ASSESSMENT AND PLAN

## 2013-04-10 NOTE — Telephone Encounter (Signed)
Refill

## 2013-04-16 ENCOUNTER — Encounter: Payer: Self-pay | Admitting: General Surgery

## 2013-04-16 ENCOUNTER — Ambulatory Visit: Payer: Self-pay | Admitting: General Surgery

## 2013-04-25 ENCOUNTER — Ambulatory Visit: Payer: Self-pay | Admitting: General Surgery

## 2013-05-01 ENCOUNTER — Encounter: Payer: Self-pay | Admitting: General Surgery

## 2013-05-02 ENCOUNTER — Ambulatory Visit (INDEPENDENT_AMBULATORY_CARE_PROVIDER_SITE_OTHER): Payer: Medicare Other

## 2013-05-02 DIAGNOSIS — I4891 Unspecified atrial fibrillation: Secondary | ICD-10-CM

## 2013-05-02 DIAGNOSIS — Z7901 Long term (current) use of anticoagulants: Secondary | ICD-10-CM

## 2013-05-02 DIAGNOSIS — Z5181 Encounter for therapeutic drug level monitoring: Secondary | ICD-10-CM

## 2013-05-02 LAB — POCT INR: INR: 1.7

## 2013-05-03 ENCOUNTER — Ambulatory Visit (INDEPENDENT_AMBULATORY_CARE_PROVIDER_SITE_OTHER): Payer: Medicare Other | Admitting: General Surgery

## 2013-05-03 ENCOUNTER — Encounter: Payer: Self-pay | Admitting: General Surgery

## 2013-05-03 VITALS — BP 130/66 | HR 66 | Resp 12 | Ht 62.0 in | Wt 113.0 lb

## 2013-05-03 DIAGNOSIS — Z1239 Encounter for other screening for malignant neoplasm of breast: Secondary | ICD-10-CM

## 2013-05-03 DIAGNOSIS — I714 Abdominal aortic aneurysm, without rupture, unspecified: Secondary | ICD-10-CM

## 2013-05-03 NOTE — Progress Notes (Signed)
Patient ID: Robin [name], female   DOB: 1928-04-11, 80 y.o.   MRN: [id number]  Chief Complaint  Patient presents with  . Follow-up    mammmogram and AAA ultrasound    HPI Robin [name] is a 80 y.o. female who presents for a breast evaluation. The most recent mammogram was done on 04/24/13. Patient does perform regular self breast checks and gets regular mammograms done.  No new problems with her breasts at this time. Patient also had AAA [facility] 04/[facility].   HPI  Past Medical History  Diagnosis Date  . Hypertension   . Abdominal aortic aneurysm     4.2 cm followed by Dr. Evette CristalSankar  . Hyperlipidemia   . CAD (coronary artery disease)     Severe 3 vessel CAD with severe ischemic cardiomyopathy in 12/2006. [facility] by Dr. Earlene Plateravis: LIMA to LAD, SVG to OM1, [facility] and SVG to PDA. cardiac cath 06/11 : patent grafts.   . CHF (congestive heart failure)     w/ejection fraction 10% to 15%  . Atrial fibrillation   . Left bundle branch block   . Sick sinus syndrome     s/p dual chamber pacemaker 01/2007 post CABG and Maze procedure.   . Mitral regurgitation     Moderate to severe by echo but mild by intraoperative TEE in 2008  . Aortic stenosis     Mild to moderate with mild to moderate AI.   Marland Kitchen. Carotid artery occlusion     Moderate bilateral with right carotid bruit  . Diffuse cystic mastopathy     Past Surgical History  Procedure Laterality Date  . Insert / replace / remove pacemaker  2008    @ [facility]  . Bilateral maze procedure    . Coronary bypass graft stenosis  01/27/07    @ [facility];Dr. Earlene Plateravis  . Total vaginal hysterectomy  1998  . Cholecystectomy    . Appendectomy    . Breast lumpectomy      left  . Cardiac catheterization  2008    LAD 80% MID, LCX 100%MID, RCA 70%  . Coronary artery bypass graft  2008    not a redo candidate due to calcified aorta  . Abdominal hysterectomy  1997  . Eye surgery  2007  . Hip fracture surgery Right     Family  History  Problem Relation Age of Onset  . Heart disease Mother     Social History History  Substance Use Topics  . Smoking status: Former Smoker -- 0.25 packs/day for 50 years    Types: Cigarettes    Quit date: 08/01/2006  . Smokeless tobacco: Not on file  . Alcohol Use: 0.6 oz/week    1 Glasses of wine per week     Comment: occasional    Allergies  Allergen Reactions  . Amoxicillin   . Codeine     Nausea/vomiting/diarrhea    Current Outpatient Prescriptions  Medication Sig Dispense Refill  . atorvastatin (LIPITOR) 40 MG tablet TAKE ONE TABLET BY MOUTH EVERY DAY  90 tablet  1  . Calcium Citrate-Vitamin D (CALCIUM CITRATE + PO) Take 1,900 mg by mouth 2 (two) times daily.      . cholecalciferol (VITAMIN D) 1000 UNITS tablet Take 1,000 Units by mouth daily.       . clonazePAM (KLONOPIN) 0.5 MG tablet TAKE 1 TABLET BY MOUTH THREE TIMES DAILY AS NEEDED  90 tablet  0  . FLUoxetine (PROZAC) 40 MG  capsule TAKE 1 CAPSULE BY MOUTH EVERY DAY  90 capsule  0  . metoprolol succinate (TOPROL XL) 25 MG 24 hr tablet Take 1 tablet (25 mg total) by mouth daily.  30 tablet  6  . Multiple Vitamin (MULTIVITAMIN) tablet Take 1 tablet by mouth daily.      . Probiotic Product (ALIGN) 4 MG CAPS Take by mouth daily.      . ramipril (ALTACE) 5 MG capsule Take 1 capsule (5 mg total) by mouth daily.  90 capsule  3  . traMADol (ULTRAM) 50 MG tablet Take 25 mg by mouth 3 (three) times daily as needed for pain.      Marland Kitchen warfarin (COUMADIN) 3 MG tablet Take as directed.       No current facility-administered medications for this visit.    Review of Systems Review of Systems  Constitutional: Negative.   Respiratory: Negative.   Cardiovascular: Negative.     Blood pressure 130/66, pulse 66, resp. rate 12, height 5\' 2"  (1.575 m), weight 113 lb (51.256 kg).  Physical Exam Physical Exam  Constitutional: She is oriented to person, place, and time. She appears well-developed and well-nourished.  Eyes:  Conjunctivae are normal. Pupils are equal, round, and reactive to light. No scleral icterus.  Neck: Neck supple. No thyromegaly present.  Cardiovascular: Normal rate and regular rhythm.   Murmur heard.  Systolic murmur is present with a grade of 3/6  Pulses:      Carotid pulses are 2+ on the right side, and 2+ on the left side.      Dorsalis pedis pulses are 2+ on the right side, and 2+ on the left side.       Posterior tibial pulses are 2+ on the right side, and 2+ on the left side.  Feet are warm, color is normal, no edema.   Pulmonary/Chest: Effort normal and breath sounds normal. Right breast exhibits no inverted nipple, no mass, no nipple discharge, no skin change and no tenderness. Left breast exhibits no inverted nipple, no mass, no nipple discharge, no skin change and no tenderness.  Abdominal: Soft. Normal appearance and bowel sounds are normal. There is no hepatosplenomegaly. There is no tenderness. No hernia.  Pulsatile prominence of aorta in epigastrium. Prominent bruit over this.   Lymphadenopathy:    She has no cervical adenopathy.    She has no axillary adenopathy.  Neurological: She is alert and oriented to person, place, and time.  Skin: Skin is warm and dry.    Data Reviewed Mammogram and AAA ultrasound reviewed. AAA 3.6cm, stable. Mammogram normal  Assessment    Stable exam. Small AAA, stable breast exam.    Plan    1 yr f/u        [facility] 05/03/2013, 10:59 AM

## 2013-05-03 NOTE — Patient Instructions (Addendum)
Patient to return in 1 year with bilateral screening mammogram and AAA ultrasound. Patient to continue self breast checks monthly. Advised to contact our office with any questions or concerns.

## 2013-05-14 ENCOUNTER — Encounter: Payer: Self-pay | Admitting: Internal Medicine

## 2013-05-23 ENCOUNTER — Ambulatory Visit (INDEPENDENT_AMBULATORY_CARE_PROVIDER_SITE_OTHER): Payer: Medicare Other

## 2013-05-23 DIAGNOSIS — Z5181 Encounter for therapeutic drug level monitoring: Secondary | ICD-10-CM

## 2013-05-23 DIAGNOSIS — I4891 Unspecified atrial fibrillation: Secondary | ICD-10-CM

## 2013-05-23 DIAGNOSIS — Z7901 Long term (current) use of anticoagulants: Secondary | ICD-10-CM

## 2013-05-23 LAB — HM MAMMOGRAPHY

## 2013-05-23 LAB — POCT INR: INR: 1.6

## 2013-06-06 ENCOUNTER — Encounter: Payer: Self-pay | Admitting: Internal Medicine

## 2013-06-06 ENCOUNTER — Ambulatory Visit (INDEPENDENT_AMBULATORY_CARE_PROVIDER_SITE_OTHER): Payer: Medicare Other

## 2013-06-06 ENCOUNTER — Ambulatory Visit (INDEPENDENT_AMBULATORY_CARE_PROVIDER_SITE_OTHER): Payer: Medicare Other | Admitting: Internal Medicine

## 2013-06-06 VITALS — BP 120/60 | HR 64 | Temp 98.1°F | Wt 110.0 lb

## 2013-06-06 DIAGNOSIS — Z5181 Encounter for therapeutic drug level monitoring: Secondary | ICD-10-CM

## 2013-06-06 DIAGNOSIS — F411 Generalized anxiety disorder: Secondary | ICD-10-CM

## 2013-06-06 DIAGNOSIS — I2510 Atherosclerotic heart disease of native coronary artery without angina pectoris: Secondary | ICD-10-CM

## 2013-06-06 DIAGNOSIS — Z9889 Other specified postprocedural states: Secondary | ICD-10-CM

## 2013-06-06 DIAGNOSIS — Z7901 Long term (current) use of anticoagulants: Secondary | ICD-10-CM

## 2013-06-06 DIAGNOSIS — Z780 Asymptomatic menopausal state: Secondary | ICD-10-CM | POA: Insufficient documentation

## 2013-06-06 DIAGNOSIS — Z23 Encounter for immunization: Secondary | ICD-10-CM

## 2013-06-06 DIAGNOSIS — E785 Hyperlipidemia, unspecified: Secondary | ICD-10-CM

## 2013-06-06 DIAGNOSIS — F419 Anxiety disorder, unspecified: Secondary | ICD-10-CM

## 2013-06-06 DIAGNOSIS — D649 Anemia, unspecified: Secondary | ICD-10-CM

## 2013-06-06 DIAGNOSIS — I4891 Unspecified atrial fibrillation: Secondary | ICD-10-CM

## 2013-06-06 DIAGNOSIS — Z8781 Personal history of (healed) traumatic fracture: Secondary | ICD-10-CM

## 2013-06-06 LAB — CBC WITH DIFFERENTIAL/PLATELET
Basophils Absolute: 0.1 10*3/uL (ref 0.0–0.1)
Basophils Relative: 1.1 % (ref 0.0–3.0)
EOS PCT: 4.3 % (ref 0.0–5.0)
Eosinophils Absolute: 0.3 10*3/uL (ref 0.0–0.7)
HCT: 37.5 % (ref 36.0–46.0)
HEMOGLOBIN: 12.5 g/dL (ref 12.0–15.0)
Lymphocytes Relative: 18.7 % (ref 12.0–46.0)
Lymphs Abs: 1.2 10*3/uL (ref 0.7–4.0)
MCHC: 33.4 g/dL (ref 30.0–36.0)
MCV: 93.9 fl (ref 78.0–100.0)
Monocytes Absolute: 0.7 10*3/uL (ref 0.1–1.0)
Monocytes Relative: 11.6 % (ref 3.0–12.0)
NEUTROS ABS: 4.1 10*3/uL (ref 1.4–7.7)
Neutrophils Relative %: 64.3 % (ref 43.0–77.0)
PLATELETS: 236 10*3/uL (ref 150.0–400.0)
RBC: 3.99 Mil/uL (ref 3.87–5.11)
RDW: 15.3 % — ABNORMAL HIGH (ref 11.5–14.6)
WBC: 6.4 10*3/uL (ref 4.5–10.5)

## 2013-06-06 LAB — COMPREHENSIVE METABOLIC PANEL
ALBUMIN: 4.2 g/dL (ref 3.5–5.2)
ALT: 31 U/L (ref 0–35)
AST: 41 U/L — ABNORMAL HIGH (ref 0–37)
Alkaline Phosphatase: 74 U/L (ref 39–117)
BUN: 22 mg/dL (ref 6–23)
CALCIUM: 9.5 mg/dL (ref 8.4–10.5)
CHLORIDE: 106 meq/L (ref 96–112)
CO2: 23 mEq/L (ref 19–32)
Creatinine, Ser: 1.1 mg/dL (ref 0.4–1.2)
GFR: 48.88 mL/min — ABNORMAL LOW (ref >60.00)
Glucose, Bld: 85 mg/dL (ref 70–99)
POTASSIUM: 5.1 meq/L (ref 3.5–5.1)
Sodium: 137 mEq/L (ref 135–145)
Total Bilirubin: 1.1 mg/dL (ref 0.3–1.2)
Total Protein: 7.7 g/dL (ref 6.0–8.3)

## 2013-06-06 LAB — LIPID PANEL
CHOL/HDL RATIO: 2
Cholesterol: 168 mg/dL (ref 0–200)
HDL: 79.5 mg/dL (ref >39.00)
LDL Cholesterol: 67 mg/dL (ref 0–99)
Triglycerides: 108 mg/dL (ref 0.0–149.0)
VLDL: 21.6 mg/dL (ref 0.0–40.0)

## 2013-06-06 LAB — POCT INR: INR: 3.6

## 2013-06-06 MED ORDER — CLONAZEPAM 0.5 MG PO TABS: 0.5000 mg | ORAL_TABLET | Freq: Three times a day (TID) | ORAL | Status: DC | PRN

## 2013-06-06 MED ORDER — FLUOXETINE HCL 40 MG PO CAPS: 40.0000 mg | ORAL_CAPSULE | Freq: Every day | ORAL | Status: DC

## 2013-06-06 MED ORDER — ATORVASTATIN CALCIUM 40 MG PO TABS: 40.0000 mg | ORAL_TABLET | Freq: Every day | ORAL | Status: DC

## 2013-06-06 NOTE — Assessment & Plan Note (Signed)
Appears to be in NSR today. Continue Toprol and anticoagulation with coumadin. Follow up with Dr. Kirke CorinArida.

## 2013-06-06 NOTE — Assessment & Plan Note (Signed)
Doing well. No current pain symptoms. Completed PT and is walking regularly. No recent falls. Will set up routine bone density testing.

## 2013-06-06 NOTE — Assessment & Plan Note (Signed)
Previous h/o anemia. Will recheck CBC with labs today. 

## 2013-06-06 NOTE — Progress Notes (Signed)
Subjective:    Patient ID: Robin [name], female    DOB: 04/06/1928, 80 y.o.   MRN: [id number]  HPI 80YO female presents for follow up.  Right hip fracture - [place] in [place]. Pt tripped on curb and fell into grass. s/p ORIF. Had PT in [place] and locally. Doing well. Walking. No pain.  No recent issues with palpitations. Occasional chest pain which "comes and goes." This is unchanged for her. Continues on Coumadin. Followed by Dr. Kirke CorinArida. No new concerns today.  Review of Systems  Constitutional: Negative for fever, chills, appetite change, fatigue and unexpected weight change.  HENT: Negative for congestion, ear pain, sinus pressure, sore throat, trouble swallowing and voice change.   Eyes: Negative for visual disturbance.  Respiratory: Negative for cough, shortness of breath, wheezing and stridor.   Cardiovascular: Positive for chest pain. Negative for palpitations and leg swelling.  Gastrointestinal: Negative for nausea, vomiting, abdominal pain, diarrhea, constipation, blood in stool, abdominal distention and anal bleeding.  Genitourinary: Negative for dysuria and flank pain.  Musculoskeletal: Negative for arthralgias, gait problem, myalgias and neck pain.  Skin: Negative for color change and rash.  Neurological: Negative for dizziness and headaches.  Hematological: Negative for adenopathy. Does not bruise/bleed easily.  Psychiatric/Behavioral: Negative for suicidal ideas, sleep disturbance and dysphoric mood. The patient is not nervous/anxious.        Objective:    BP 120/60  Pulse 64  Temp(Src) 98.1 F (36.7 C) (Oral)  Wt 110 lb (49.896 kg)  SpO2 97% Physical Exam  Constitutional: She is oriented to person, place, and time. She appears well-developed and well-nourished. No distress.  HENT:  Head: Normocephalic and atraumatic.  Right Ear: External ear normal.  Left Ear: External ear normal.  Nose: Nose normal.  Mouth/Throat: Oropharynx is clear and moist. No  oropharyngeal exudate.  Eyes: Conjunctivae are normal. Pupils are equal, round, and reactive to light. Right eye exhibits no discharge. Left eye exhibits no discharge. No scleral icterus.  Neck: Normal range of motion. Neck supple. No tracheal deviation present. No thyromegaly present.  Cardiovascular: Normal rate, regular rhythm, normal heart sounds and intact distal pulses.  Exam reveals no gallop and no friction rub.   No murmur heard. Pulmonary/Chest: Effort normal and breath sounds normal. No accessory muscle usage. Not tachypneic. No respiratory distress. She has no decreased breath sounds. She has no wheezes. She has no rhonchi. She has no rales. She exhibits no tenderness.  Musculoskeletal: Normal range of motion. She exhibits no edema and no tenderness.  Lymphadenopathy:    She has no cervical adenopathy.  Neurological: She is alert and oriented to person, place, and time. No cranial nerve deficit. She exhibits normal muscle tone. Coordination normal.  Skin: Skin is warm and dry. No rash noted. She is not diaphoretic. No erythema. No pallor.  Psychiatric: She has a normal mood and affect. Her behavior is normal. Judgment and thought content normal.          Assessment & Plan:   Problem List Items Addressed This Visit   Anemia     Previous h/o anemia. Will recheck CBC with labs today.    Relevant Orders      CBC w/Diff   Anxiety     Symptoms well controlled with current medications. Will continue.    Relevant Medications      FLUoxetine (PROZAC) 40 MG capsule   Atrial fibrillation     Appears to be in NSR today. Continue Toprol and anticoagulation with coumadin.  Follow up with Dr. Kirke Corin.    Relevant Medications      atorvastatin (LIPITOR) tablet   CAD (coronary artery disease)     Doing well. Occasional "fleeting" chest pain but no symptoms of persistent pain. Tolerating exercise with walking. Continue current medications.    Relevant Medications      atorvastatin  (LIPITOR) tablet   Hyperlipidemia - Primary     Will check lipids and LFTs with labs today. Continue Atorvastatin.    Relevant Medications      atorvastatin (LIPITOR) tablet   Other Relevant Orders      Comprehensive metabolic panel      Lipid panel   Need for prophylactic vaccination against Streptococcus pneumoniae (pneumococcus)   Relevant Orders      Pneumococcal conjugate vaccine 13-valent (Completed)   Postmenopausal estrogen deficiency     Will set up bone density testing.    Relevant Orders      DG Bone Density   S/P ORIF (open reduction internal fixation) fracture     Doing well. No current pain symptoms. Completed PT and is walking regularly. No recent falls. Will set up routine bone density testing.        Return in about 6 months (around 12/06/2013) for Wellness Visit.

## 2013-06-06 NOTE — Assessment & Plan Note (Signed)
Will set up bone density testing. 

## 2013-06-06 NOTE — Assessment & Plan Note (Signed)
Doing well. Occasional "fleeting" chest pain but no symptoms of persistent pain. Tolerating exercise with walking. Continue current medications.

## 2013-06-06 NOTE — Assessment & Plan Note (Signed)
Symptoms well controlled with current medications. Will continue. 

## 2013-06-06 NOTE — Assessment & Plan Note (Signed)
Will check lipids and LFTs with labs today. Continue Atorvastatin. 

## 2013-06-06 NOTE — Progress Notes (Signed)
Pre visit review using our clinic review tool, if applicable. No additional management support is needed unless otherwise documented below in the visit note. 

## 2013-06-07 ENCOUNTER — Telehealth: Payer: Self-pay

## 2013-06-07 MED ORDER — WARFARIN SODIUM 3 MG PO TABS: ORAL_TABLET | ORAL | Status: DC

## 2013-06-07 NOTE — Telephone Encounter (Signed)
Patient is requesting refill on Warfarin would like it sent to [facility] in [place].  Please review and refill, Thank You.

## 2013-06-13 ENCOUNTER — Ambulatory Visit (INDEPENDENT_AMBULATORY_CARE_PROVIDER_SITE_OTHER): Payer: Medicare Other

## 2013-06-13 DIAGNOSIS — Z5181 Encounter for therapeutic drug level monitoring: Secondary | ICD-10-CM

## 2013-06-13 DIAGNOSIS — Z7901 Long term (current) use of anticoagulants: Secondary | ICD-10-CM

## 2013-06-13 DIAGNOSIS — I4891 Unspecified atrial fibrillation: Secondary | ICD-10-CM

## 2013-06-13 LAB — POCT INR: INR: 2.7

## 2013-07-04 ENCOUNTER — Ambulatory Visit (INDEPENDENT_AMBULATORY_CARE_PROVIDER_SITE_OTHER): Payer: Medicare Other

## 2013-07-04 DIAGNOSIS — Z7901 Long term (current) use of anticoagulants: Secondary | ICD-10-CM

## 2013-07-04 DIAGNOSIS — Z5181 Encounter for therapeutic drug level monitoring: Secondary | ICD-10-CM

## 2013-07-04 DIAGNOSIS — I4891 Unspecified atrial fibrillation: Secondary | ICD-10-CM

## 2013-07-04 LAB — POCT INR: INR: 2.7

## 2013-07-25 ENCOUNTER — Ambulatory Visit (INDEPENDENT_AMBULATORY_CARE_PROVIDER_SITE_OTHER): Payer: Medicare Other

## 2013-07-25 DIAGNOSIS — I4891 Unspecified atrial fibrillation: Secondary | ICD-10-CM

## 2013-07-25 DIAGNOSIS — Z5181 Encounter for therapeutic drug level monitoring: Secondary | ICD-10-CM

## 2013-07-25 DIAGNOSIS — Z7901 Long term (current) use of anticoagulants: Secondary | ICD-10-CM

## 2013-07-25 LAB — POCT INR: INR: 2.3

## 2013-08-09 ENCOUNTER — Ambulatory Visit (INDEPENDENT_AMBULATORY_CARE_PROVIDER_SITE_OTHER): Payer: Medicare Other | Admitting: Cardiovascular Disease

## 2013-08-09 ENCOUNTER — Encounter: Payer: Self-pay | Admitting: Cardiovascular Disease

## 2013-08-09 VITALS — BP 112/52 | HR 60 | Ht 64.0 in | Wt 110.2 lb

## 2013-08-09 DIAGNOSIS — I359 Nonrheumatic aortic valve disorder, unspecified: Secondary | ICD-10-CM

## 2013-08-09 DIAGNOSIS — I4891 Unspecified atrial fibrillation: Secondary | ICD-10-CM

## 2013-08-09 DIAGNOSIS — R079 Chest pain, unspecified: Secondary | ICD-10-CM

## 2013-08-09 DIAGNOSIS — I2510 Atherosclerotic heart disease of native coronary artery without angina pectoris: Secondary | ICD-10-CM

## 2013-08-09 DIAGNOSIS — I350 Nonrheumatic aortic (valve) stenosis: Secondary | ICD-10-CM

## 2013-08-09 DIAGNOSIS — E785 Hyperlipidemia, unspecified: Secondary | ICD-10-CM

## 2013-08-09 DIAGNOSIS — R000 Tachycardia, unspecified: Secondary | ICD-10-CM

## 2013-08-09 NOTE — Progress Notes (Signed)
HPI  Mrs. Danton SewerWitherby is a pleasant 80 -year-old female who is here today for a followup visit. In 2008 she presented with non-ST elevation myocardial infarction, heart failure with severely reduced LV systolic function (EF10-15%) as well as atrial fibrillation. Cardiac catheterization at that time showed severe three-vessel coronary artery disease. She [facility] by Dr. Earlene Plateravis as well as Maze procedure. She had heart block that required dual-chamber pacemaker placement. She has done well since then with medical therapy. Her ejection fraction gradually improved.  Echo in 2013 showed improvement to 50-55% and most recently 45-50% with moderate mitral regurgitation and moderate aortic stenosis.  Marland Kitchen. She was hospitalized briefly in September, 2013 at [facility] with atrial fibrillation with rapid ventricular response. The dose of Toprol was increased from 50 to100 mg once daily. She felt very tired after increasing the dose of Toprol and I decided to decrease the dose back to 50 mg once daily.  She  went to [place] in October, 2014 with husband to celebrate anniversary. Unfortunately, she fell there and broke right hip. She underwent surgical repair. Post op was complicated by hypoxia and pneumonia which was treated. No cardiac complications. She has been doing reasonably well. Palpitations and dyspnea improved after resuming small dose Toprol.  She gets mild chest discomfort and dyspnea if he overexerts himself. She is now taking care of her husband who had recent knee replacement. [place] are thinking of moving to [place].   Allergies  Allergen Reactions  . Amoxicillin   . Codeine     Nausea/vomiting/diarrhea     Current Outpatient Prescriptions on File Prior to Visit  Medication Sig Dispense Refill  . atorvastatin (LIPITOR) 40 MG tablet Take 1 tablet (40 mg total) by mouth daily at 6 PM.  90 tablet  1  . Calcium Citrate-Vitamin D (CALCIUM CITRATE + PO) Take 1,900 mg by mouth 2 (two) times  daily.      . cholecalciferol (VITAMIN D) 1000 UNITS tablet Take 1,000 Units by mouth daily.       . clonazePAM (KLONOPIN) 0.5 MG tablet Take 1 tablet (0.5 mg total) by mouth 3 (three) times daily as needed for anxiety.  90 tablet  3  . FLUoxetine (PROZAC) 40 MG capsule Take 1 capsule (40 mg total) by mouth daily.  90 capsule  1  . metoprolol succinate (TOPROL XL) 25 MG 24 hr tablet Take 1 tablet (25 mg total) by mouth daily.  30 tablet  6  . Multiple Vitamin (MULTIVITAMIN) tablet Take 1 tablet by mouth daily.      . Probiotic Product (ALIGN) 4 MG CAPS Take by mouth daily.      . ramipril (ALTACE) 5 MG capsule Take 1 capsule (5 mg total) by mouth daily.  90 capsule  3  . warfarin (COUMADIN) 3 MG tablet Take as directed by Coumadin Clinic  120 tablet  1   No current facility-administered medications on file prior to visit.     Past Medical History  Diagnosis Date  . Hypertension   . Abdominal aortic aneurysm     4.2 cm followed by Dr. Evette CristalSankar  . Hyperlipidemia   . CAD (coronary artery disease)     Severe 3 vessel CAD with severe ischemic cardiomyopathy in 12/2006. s/p [facility]G [facility] by Dr. Earlene Plateravis: LIMA to LAD, SVG to OM1, [facility] to [facility] and SVG to PDA. cardiac cath 06/11 : patent grafts.   . CHF (congestive heart failure)     w/ejection fraction  10% to 15%  . Atrial fibrillation   . Left bundle branch block   . Sick sinus syndrome     s/p dual chamber pacemaker 01/2007 post CABG and Maze procedure.   . Mitral regurgitation     Moderate to severe by echo but mild by intraoperative TEE in 2008  . Aortic stenosis     Mild to moderate with mild to moderate AI.   Marland Kitchen Carotid artery occlusion     Moderate bilateral with right carotid bruit  . Diffuse cystic mastopathy      Past Surgical History  Procedure Laterality Date  . Insert / replace / remove pacemaker  2008    @ [facility]  . Bilateral maze procedure    . Coronary bypass graft stenosis  01/27/07    @ [facility];Dr. Earlene Plater  . Total vaginal  hysterectomy  1998  . Cholecystectomy    . Appendectomy    . Breast lumpectomy      left  . Cardiac catheterization  2008    LAD 80% MID, LCX 100%MID, RCA 70%  . Coronary artery bypass graft  2008    not a redo candidate due to calcified aorta  . Abdominal hysterectomy  1997  . Eye surgery  2007  . Hip fracture surgery Right      Family History  Problem Relation Age of Onset  . Heart disease Mother      History   Social History  . Marital Status: Married    Spouse Name: N/A    Number of Children: N/A  . Years of Education: N/A   Occupational History  . Not on file.   Social History Main Topics  . Smoking status: Former Smoker -- 0.25 packs/day for 50 years    Types: Cigarettes    Quit date: 08/01/2006  . Smokeless tobacco: Not on file  . Alcohol Use: 0.6 oz/week    1 Glasses of wine per week     Comment: occasional  . Drug Use: No  . Sexual Activity: Not on file   Other Topics Concern  . Not on file   Social History Narrative   Lives with husband and youngest daughter. Has 5 children.      Work - retired in 2000 from [employer]           PHYSICAL EXAM   BP 112/52  Pulse 60  Ht 5\' 4"  (1.626 m)  Wt 110 lb 4 oz (50.009 kg)  BMI 18.92 kg/m2 Constitutional: She is oriented to person, place, and time. She appears well-developed and well-nourished. No distress.  HENT: No nasal discharge.  Head: Normocephalic and atraumatic.  Eyes: Pupils are equal and round. Right eye exhibits no discharge. Left eye exhibits no discharge.  Neck: Normal range of motion. Neck supple. No JVD present. No thyromegaly present. Right carotid bruit. Cardiovascular: Normal rate, regular rhythm, normal heart sounds. Exam reveals no gallop and no friction rub. 3/6 systolic ejection murmur at the aortic area which is mid peaking with preserved S2. Pulmonary/Chest: Effort normal and breath sounds normal. No stridor. No respiratory distress. She has no wheezes. She has no rales.  She exhibits no tenderness.  Abdominal: Soft. Bowel sounds are normal. She exhibits no distension. There is no tenderness. There is no rebound and no guarding.  Musculoskeletal: Normal range of motion. She exhibits no edema and no tenderness.  Neurological: She is alert and oriented to person, place, and time. Coordination normal.  Skin: Skin is warm and dry. No  rash noted. She is not diaphoretic. No erythema. No pallor.  Psychiatric: She has a normal mood and affect. Her behavior is normal. Judgment and thought content normal.     FBP:ZWCHEN  paced rhythm P:QRS - 1:1, Abnormal P axis, H Rate 60 -Left bundle branch block.   ABNORMAL    ASSESSMENT AND PLAN

## 2013-08-09 NOTE — Assessment & Plan Note (Signed)
She has mild angina which has been stable. Continue medical therapy.

## 2013-08-09 NOTE — Assessment & Plan Note (Signed)
She is maintaining in sinus rhythm with rare episodes of palpitations. Continue small dose Toprol and anticoagulation with warfarin.

## 2013-08-09 NOTE — Patient Instructions (Signed)
Continue same medications.   Your physician wants you to follow-up in: 4 months.  You will receive a reminder letter in the mail two months in advance. If you don't receive a letter, please call our office to schedule the follow-up appointment.  

## 2013-08-09 NOTE — Assessment & Plan Note (Signed)
This was moderate by echocardiogram in 2014.

## 2013-08-09 NOTE — Assessment & Plan Note (Signed)
Lab Results  Component Value Date   CHOL 168 06/06/2013   HDL 79.50 06/06/2013   LDLCALC 67 06/06/2013   TRIG 108.0 06/06/2013   CHOLHDL 2 06/06/2013   Continue same dose atorvastatin.

## 2013-08-17 ENCOUNTER — Other Ambulatory Visit: Payer: Self-pay | Admitting: Cardiovascular Disease

## 2013-09-05 ENCOUNTER — Ambulatory Visit (INDEPENDENT_AMBULATORY_CARE_PROVIDER_SITE_OTHER): Payer: Medicare Other

## 2013-09-05 DIAGNOSIS — Z7901 Long term (current) use of anticoagulants: Secondary | ICD-10-CM

## 2013-09-05 DIAGNOSIS — Z5181 Encounter for therapeutic drug level monitoring: Secondary | ICD-10-CM

## 2013-09-05 DIAGNOSIS — I4891 Unspecified atrial fibrillation: Secondary | ICD-10-CM

## 2013-09-05 LAB — POCT INR: INR: 3.1

## 2013-10-10 ENCOUNTER — Ambulatory Visit (INDEPENDENT_AMBULATORY_CARE_PROVIDER_SITE_OTHER): Payer: Medicare Other | Admitting: *Deleted

## 2013-10-10 DIAGNOSIS — I4891 Unspecified atrial fibrillation: Secondary | ICD-10-CM

## 2013-10-10 DIAGNOSIS — Z5181 Encounter for therapeutic drug level monitoring: Secondary | ICD-10-CM

## 2013-10-10 DIAGNOSIS — Z7901 Long term (current) use of anticoagulants: Secondary | ICD-10-CM

## 2013-10-10 LAB — POCT INR: INR: 2.6

## 2013-10-11 ENCOUNTER — Encounter (INDEPENDENT_AMBULATORY_CARE_PROVIDER_SITE_OTHER): Payer: Medicare Other

## 2013-10-11 ENCOUNTER — Other Ambulatory Visit (HOSPITAL_COMMUNITY): Payer: Self-pay | Admitting: Cardiology

## 2013-10-11 DIAGNOSIS — I6529 Occlusion and stenosis of unspecified carotid artery: Secondary | ICD-10-CM

## 2013-10-23 ENCOUNTER — Encounter: Payer: Self-pay | Admitting: *Deleted

## 2013-11-14 ENCOUNTER — Ambulatory Visit (INDEPENDENT_AMBULATORY_CARE_PROVIDER_SITE_OTHER): Payer: Medicare Other

## 2013-11-14 DIAGNOSIS — Z7901 Long term (current) use of anticoagulants: Secondary | ICD-10-CM

## 2013-11-14 DIAGNOSIS — Z5181 Encounter for therapeutic drug level monitoring: Secondary | ICD-10-CM

## 2013-11-14 DIAGNOSIS — I4891 Unspecified atrial fibrillation: Secondary | ICD-10-CM

## 2013-11-14 LAB — POCT INR: INR: 3

## 2013-11-15 ENCOUNTER — Ambulatory Visit (INDEPENDENT_AMBULATORY_CARE_PROVIDER_SITE_OTHER): Payer: Medicare Other | Admitting: Cardiovascular Disease

## 2013-11-15 ENCOUNTER — Encounter: Payer: Self-pay | Admitting: Cardiovascular Disease

## 2013-11-15 VITALS — BP 130/60 | HR 60 | Ht 64.0 in | Wt 114.2 lb

## 2013-11-15 DIAGNOSIS — I350 Nonrheumatic aortic (valve) stenosis: Secondary | ICD-10-CM

## 2013-11-15 DIAGNOSIS — I4891 Unspecified atrial fibrillation: Secondary | ICD-10-CM

## 2013-11-15 DIAGNOSIS — I714 Abdominal aortic aneurysm, without rupture, unspecified: Secondary | ICD-10-CM

## 2013-11-15 DIAGNOSIS — I495 Sick sinus syndrome: Secondary | ICD-10-CM

## 2013-11-15 DIAGNOSIS — I2510 Atherosclerotic heart disease of native coronary artery without angina pectoris: Secondary | ICD-10-CM

## 2013-11-15 DIAGNOSIS — I6529 Occlusion and stenosis of unspecified carotid artery: Secondary | ICD-10-CM

## 2013-11-15 DIAGNOSIS — I359 Nonrheumatic aortic valve disorder, unspecified: Secondary | ICD-10-CM

## 2013-11-15 MED ORDER — METOPROLOL SUCCINATE ER 25 MG PO TB24: ORAL_TABLET | ORAL | Status: DC

## 2013-11-15 NOTE — Assessment & Plan Note (Signed)
This was moderate by echocardiogram in 2014. Recommend a followup echocardiogram in 2016.

## 2013-11-15 NOTE — Assessment & Plan Note (Signed)
She is maintaining in sinus rhythm. Continue long-term anticoagulation with warfarin. 

## 2013-11-15 NOTE — Progress Notes (Signed)
HPI  Robin [name] is a pleasant 80 -year-old female who is here today for a followup visit. In 2008 she presented with non-ST elevation myocardial infarction, heart failure with severely reduced LV systolic function (EF10-15%) as well as atrial fibrillation. Cardiac catheterization at that time showed severe three-vessel coronary artery disease. She underwent CABG at [facility] by Dr. Earlene Plater as well as Maze procedure. She had heart block that required dual-chamber pacemaker placement. She has done well since then with medical therapy. Her ejection fraction gradually improved.  Echo in 2013 showed improvement to 50-55% and most recently 45-50% with moderate mitral regurgitation and moderate aortic stenosis.  Marland Kitchen She was hospitalized briefly in September, 2013 at [facility] with atrial fibrillation with rapid ventricular response. The dose of Toprol was increased from 50 to100 mg once daily. She felt very tired after increasing the dose of Toprol and I decided to decrease the dose back to 50 mg once daily.  She  went to [facility] in October, 2014 with husband to celebrate anniversary. Unfortunately, she fell there and broke right hip. She underwent surgical repair. Post op was complicated by hypoxia and pneumonia which was treated. No cardiac complications. She has been doing reasonably well.  She gets mild chest discomfort and dyspnea if he overexerts himself. She is now taking care of her husband who has multiple medical issues. They sold their house and will be moving  to [place].   Allergies  Allergen Reactions  . Amoxicillin   . Codeine     Nausea/vomiting/diarrhea     Current Outpatient Prescriptions on File Prior to Visit  Medication Sig Dispense Refill  . atorvastatin (LIPITOR) 40 MG tablet Take 1 tablet (40 mg total) by mouth daily at 6 PM.  90 tablet  1  . Calcium Citrate-Vitamin D (CALCIUM CITRATE + PO) Take 1,900 mg by mouth 2 (two) times daily.      . cholecalciferol (VITAMIN D) 1000 UNITS  tablet Take 1,000 Units by mouth daily.       . clonazePAM (KLONOPIN) 0.5 MG tablet Take 1 tablet (0.5 mg total) by mouth 3 (three) times daily as needed for anxiety.  90 tablet  3  . Cyanocobalamin (VITAMIN B-12) 1000 MCG/15ML LIQD Take by mouth daily.      Marland Kitchen FLUoxetine (PROZAC) 40 MG capsule Take 1 capsule (40 mg total) by mouth daily.  90 capsule  1  . metoprolol succinate (TOPROL-XL) 25 MG 24 hr tablet TAKE 1 TABLET BY MOUTH DAILY.  30 tablet  3  . Multiple Vitamin (MULTIVITAMIN) tablet Take 1 tablet by mouth daily.      . Probiotic Product (ALIGN) 4 MG CAPS Take by mouth daily.      . ramipril (ALTACE) 5 MG capsule Take 1 capsule (5 mg total) by mouth daily.  90 capsule  3  . warfarin (COUMADIN) 3 MG tablet Take as directed by Coumadin Clinic  120 tablet  1   No current facility-administered medications on file prior to visit.     Past Medical History  Diagnosis Date  . Hypertension   . Abdominal aortic aneurysm     4.2 cm followed by Dr. Evette Cristal  . Hyperlipidemia   . CAD (coronary artery disease)     Severe 3 vessel CAD with severe ischemic cardiomyopathy in 12/2006. s/p [facility]G at [facility] by Dr. Earlene Plater: LIMA to LAD, SVG to OM1, SVG to [facility] and SVG to PDA. cardiac cath 06/11 : patent grafts.   . CHF (congestive heart  failure)     w/ejection fraction 10% to 15%  . Atrial fibrillation   . Left bundle branch block   . Sick sinus syndrome     s/p dual chamber pacemaker 01/2007 post CABG and Maze procedure.   . Mitral regurgitation     Moderate to severe by echo but mild by intraoperative TEE in 2008  . Aortic stenosis     Mild to moderate with mild to moderate AI.   Marland Kitchen Carotid artery occlusion     Moderate bilateral with right carotid bruit  . Diffuse cystic mastopathy      Past Surgical History  Procedure Laterality Date  . Insert / replace / remove pacemaker  2008    @ [facility]  . Bilateral maze procedure    . Coronary bypass graft stenosis  01/27/07    @ [facility];Dr. Earlene Plater  . Total  vaginal hysterectomy  1998  . Cholecystectomy    . Appendectomy    . Breast lumpectomy      left  . Cardiac catheterization  2008    LAD 80% MID, LCX 100%MID, RCA 70%  . Coronary artery bypass graft  2008    not a redo candidate due to calcified aorta  . Abdominal hysterectomy  1997  . Eye surgery  2007  . Hip fracture surgery Right      Family History  Problem Relation Age of Onset  . Heart disease Mother      History   Social History  . Marital Status: Married    Spouse Name: N/A    Number of Children: N/A  . Years of Education: N/A   Occupational History  . Not on file.   Social History Main Topics  . Smoking status: Former Smoker -- 0.25 packs/day for 50 years    Types: Cigarettes    Quit date: 08/01/2006  . Smokeless tobacco: Not on file  . Alcohol Use: 0.6 oz/week    1 Glasses of wine per week     Comment: occasional  . Drug Use: No  . Sexual Activity: Not on file   Other Topics Concern  . Not on file   Social History Narrative   Lives with husband and youngest daughter. Has 5 children.      Work - retired in 2000 from [employer]           PHYSICAL EXAM   BP 130/60  Pulse 60  Ht  (1.626 m)  Wt 114 lb 4 oz (51.823 kg)  BMI 19.60 kg/m2 Constitutional: She is oriented to person, place, and time. She appears well-developed and well-nourished. No distress.  HENT: No nasal discharge.  Head: Normocephalic and atraumatic.  Eyes: Pupils are equal and round. Right eye exhibits no discharge. Left eye exhibits no discharge.  Neck: Normal range of motion. Neck supple. No JVD present. No thyromegaly present. Right carotid bruit. Cardiovascular: Normal rate, regular rhythm, normal heart sounds. Exam reveals no gallop and no friction rub. 3/6 systolic ejection murmur at the aortic area which is mid peaking with preserved S2. Pulmonary/Chest: Effort normal and breath sounds normal. No stridor. No respiratory distress. She has no wheezes. She has no  rales. She exhibits no tenderness.  Abdominal: Soft. Bowel sounds are normal. She exhibits no distension. There is no tenderness. There is no rebound and no guarding.  Musculoskeletal: Normal range of motion. She exhibits no edema and no tenderness.  Neurological: She is alert and oriented to person, place, and time. Coordination normal.  Skin: Skin is warm and dry. No rash noted. She is not diaphoretic. No erythema. No pallor.  Psychiatric: She has a normal mood and affect. Her behavior is normal. Judgment and thought content normal.     RUE:AVWUJW  paced rhythm P:QRS - 1:1, Abnormal P axis, H Rate 60 -Left bundle branch block.   ABNORMAL    ASSESSMENT AND PLAN  /

## 2013-11-15 NOTE — Assessment & Plan Note (Signed)
She is due for an abdominal aortic ultrasound which was ordered.

## 2013-11-15 NOTE — Patient Instructions (Addendum)
Continue same medications.  Your Toprol has been refilled   Your physician has requested that you have an abdominal aorta duplex. During this test, an ultrasound is used to evaluate the aorta. Allow 30 minutes for this exam. Do not eat after midnight the day before and avoid carbonated beverages   [facility]: [address] [place], [place] [address] Phone: [phone] -  [email or web address]   Your physician recommends that you schedule a follow-up appointment in:  As needed

## 2013-11-15 NOTE — Assessment & Plan Note (Signed)
She reports no symptoms of angina. Continue medical therapy 

## 2013-11-27 ENCOUNTER — Encounter: Payer: Self-pay | Admitting: Internal Medicine

## 2013-11-27 ENCOUNTER — Ambulatory Visit (INDEPENDENT_AMBULATORY_CARE_PROVIDER_SITE_OTHER): Payer: Medicare Other | Admitting: Internal Medicine

## 2013-11-27 VITALS — BP 116/67 | HR 60 | Ht 64.0 in | Wt 113.5 lb

## 2013-11-27 DIAGNOSIS — I495 Sick sinus syndrome: Secondary | ICD-10-CM

## 2013-11-27 DIAGNOSIS — I6529 Occlusion and stenosis of unspecified carotid artery: Secondary | ICD-10-CM

## 2013-11-27 LAB — MDC_IDC_ENUM_SESS_TYPE_INCLINIC
Brady Statistic RA Percent Paced: 99 %
Implantable Pulse Generator Serial Number: 574689
Lead Channel Impedance Value: 530 Ohm
Lead Channel Pacing Threshold Amplitude: 0.7 V
Lead Channel Pacing Threshold Amplitude: 1 V
Lead Channel Pacing Threshold Pulse Width: 0.6 ms
Lead Channel Sensing Intrinsic Amplitude: 1.8 mV
Lead Channel Sensing Intrinsic Amplitude: 7.6 mV
Lead Channel Setting Pacing Amplitude: 2 V
Lead Channel Setting Pacing Amplitude: 2.4 V
Lead Channel Setting Pacing Pulse Width: 0.6 ms
Lead Channel Setting Sensing Sensitivity: 2.5 mV
MDC IDC MSMT BATTERY REMAINING LONGEVITY: 24 mo
MDC IDC MSMT LEADCHNL RA IMPEDANCE VALUE: 540 Ohm
MDC IDC MSMT LEADCHNL RA PACING THRESHOLD PULSEWIDTH: 0.5 ms
MDC IDC STAT BRADY RV PERCENT PACED: 5 %

## 2013-11-27 NOTE — Patient Instructions (Signed)
Good luck in [place]!!!!!!!!!!  Call us if you need us!

## 2013-11-27 NOTE — Progress Notes (Signed)
Patient Care Team: Shelia MediaJennifer A Walker, MD as PCP - General (Internal Medicine) Seeplaputhur Wynona LunaG Sankar, MD (General Surgery)   HPI  Robin [name] is a 80 y.o. female Seen in pacemaker followup. It was implanted 2008 for sinus node dysfunction.  At that time, she suffered a non-STEMI complicated by heart failure and atrial fibrillation. She has severe three-vessel disease and underwent [facility] and [facility] at [facility]   And Most recent assessment of ejection fraction August 2014 45-50%  she was noted to have mild aortic stenosis (mean gradient 13)  as well as a AAA for which abdominal duplex is pending      She is on Coumadin. Alternative anticoagulants were discussed with the co-pay were very high     Past Medical History  Diagnosis Date  . Hypertension   . Abdominal aortic aneurysm     4.2 cm followed by Dr. Evette CristalSankar  . Hyperlipidemia   . CAD (coronary artery disease)     Severe 3 vessel CAD with severe ischemic cardiomyopathy in 12/2006. s/p [facility] by Dr. Earlene Plateravis: LIMA to LAD, SVG to OM1, [facility] and SVG to PDA. cardiac cath 06/11 : patent grafts.   . CHF (congestive heart failure)     w/ejection fraction 10% to 15%  . Atrial fibrillation   . Left bundle branch block   . Sick sinus syndrome     s/p dual chamber pacemaker 01/2007 post CABG and Maze procedure.   . Mitral regurgitation     Moderate to severe by echo but mild by intraoperative TEE in 2008  . Aortic stenosis     Mild to moderate with mild to moderate AI.   Marland Kitchen. Carotid artery occlusion     Moderate bilateral with right carotid bruit  . Diffuse cystic mastopathy     Past Surgical History  Procedure Laterality Date  . Insert / replace / remove pacemaker  2008    @ [facility]  . Bilateral maze procedure    . Coronary bypass graft stenosis  01/27/07    @ [facility];Dr. Earlene Plateravis  . Total vaginal hysterectomy  1998  . Cholecystectomy    . Appendectomy    . Breast lumpectomy      left  . Cardiac catheterization  2008    LAD 80%  MID, LCX 100%MID, RCA 70%  . Coronary artery bypass graft  2008    not a redo candidate due to calcified aorta  . Abdominal hysterectomy  1997  . Eye surgery  2007  . Hip fracture surgery Right     Current Outpatient Prescriptions  Medication Sig Dispense Refill  . atorvastatin (LIPITOR) 40 MG tablet Take 1 tablet (40 mg total) by mouth daily at 6 PM.  90 tablet  1  . Calcium Citrate-Vitamin D (CALCIUM CITRATE + PO) Take 1,900 mg by mouth 2 (two) times daily.      . cholecalciferol (VITAMIN D) 1000 UNITS tablet Take 1,000 Units by mouth daily.       . clonazePAM (KLONOPIN) 0.5 MG tablet Take 1 tablet (0.5 mg total) by mouth 3 (three) times daily as needed for anxiety.  90 tablet  3  . Cyanocobalamin (VITAMIN B-12) 1000 MCG/15ML LIQD Take by mouth daily.      Marland Kitchen. FLUoxetine (PROZAC) 40 MG capsule Take 1 capsule (40 mg total) by mouth daily.  90 capsule  1  . metoprolol succinate (TOPROL-XL) 25 MG 24 hr tablet TAKE 1 TABLET BY MOUTH DAILY.  30 tablet  6  .  Multiple Vitamin (MULTIVITAMIN) tablet Take 1 tablet by mouth daily.      . Probiotic Product (ALIGN) 4 MG CAPS Take by mouth daily.      . ramipril (ALTACE) 5 MG capsule Take 1 capsule (5 mg total) by mouth daily.  90 capsule  3  . warfarin (COUMADIN) 3 MG tablet Take as directed by [facility]  120 tablet  1   No current facility-administered medications for this visit.    Allergies  Allergen Reactions  . Amoxicillin   . Codeine     Nausea/vomiting/diarrhea    Review of Systems negative except from HPI and PMH  Physical Exam BP 116/67  Pulse 60  Ht 5\' 4"  (1.626 m)  Wt 113 lb 8 oz (51.483 kg)  BMI 19.47 kg/m2 Well developed and well nourished in no acute distress HENT normal E scleral and icterus clear Neck Supple JVP flat; carotids brisk and full Clear to ausculation  Regular rate and rhythm, 2-3/6 scratchy systolic murmur Soft with active bowel sounds murmur versus bruit without expanded abdominal pulsation No  clubbing cyanosis none Edema Alert and oriented, grossly normal motor and sensory function Skin Warm and Dry  ECG demonstrates atrial pacing with intrinsic conduction left bundle branch block  Assessment and  Plan  Sinus node dysfunction  Atrial fibrillation prior maze operation  Ischemic heart disease with prior bypass surgery  Abdominal aortic aneurysm  Pacemaker-[employer] The patient's device was interrogated.  The information was reviewed. No changes were made in the programming.    Without symptoms of ischemia  .noafib   The patient has a family history of abdominal aortic aneurysm; her brother just underwent surgery. I suggested that it would be appropriate for screening of her 5 children.

## 2013-11-29 IMAGING — US ULTRASOUND AORTA
1 series · 14 of 14 positions shown · non-contrast
Comparison: none

REASON FOR EXAM: known AAA
COMMENTS:

[Series 1: ultrasound aorta · 0.21mm/px · 14 of 14 slices shown]
[im 1/14]
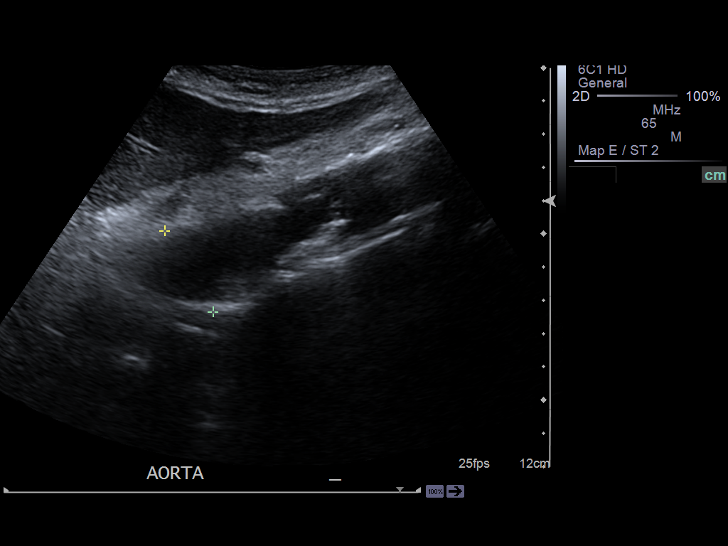
[im 2/14]
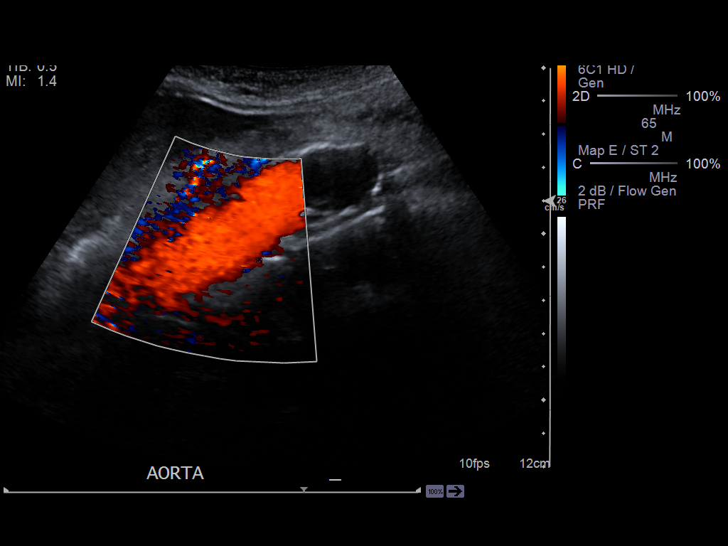
[im 3/14]
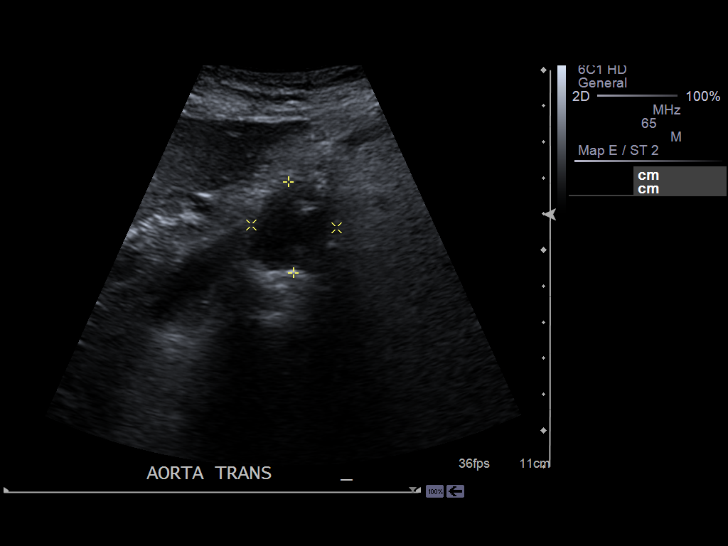
[im 4/14]
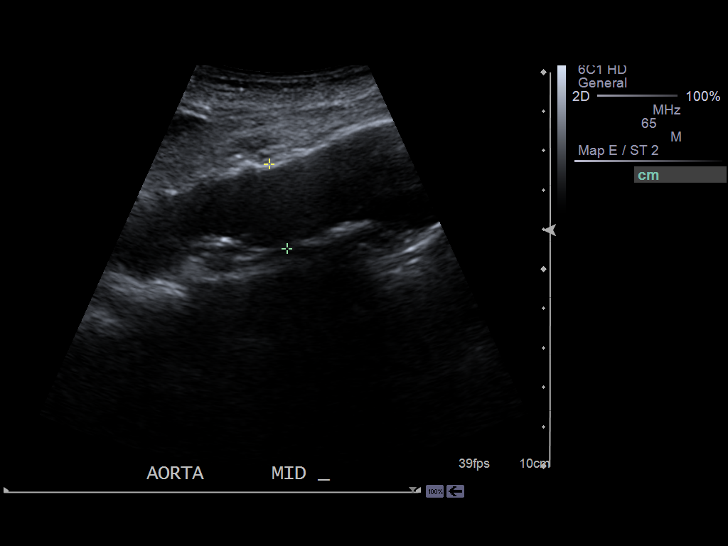
[im 5/14]
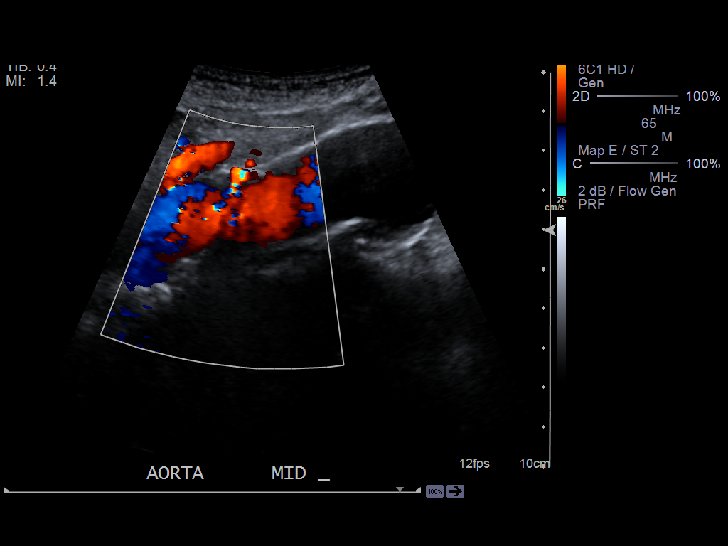
[im 6/14]
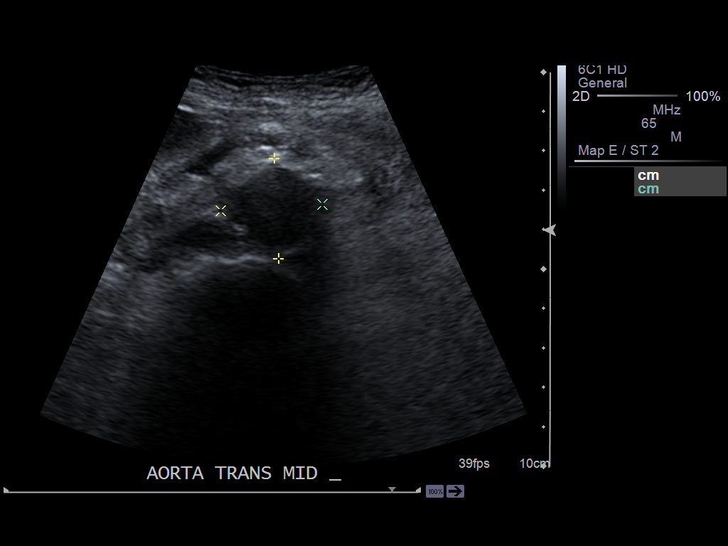
[im 7/14]
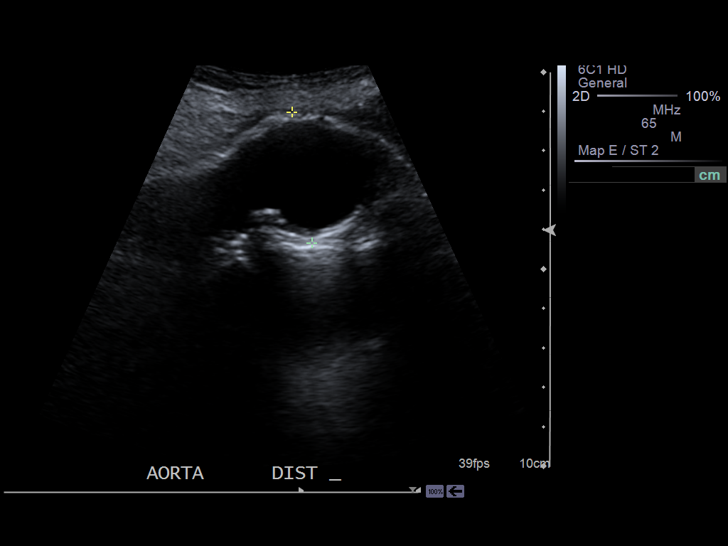
[im 8/14]
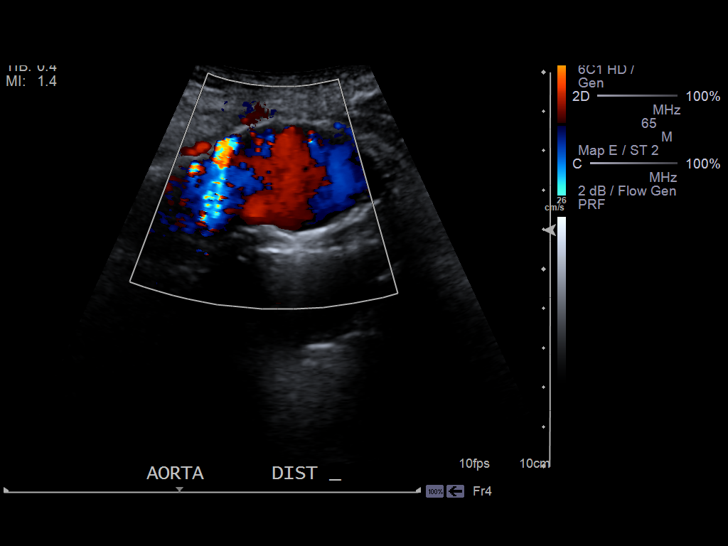
[im 9/14]
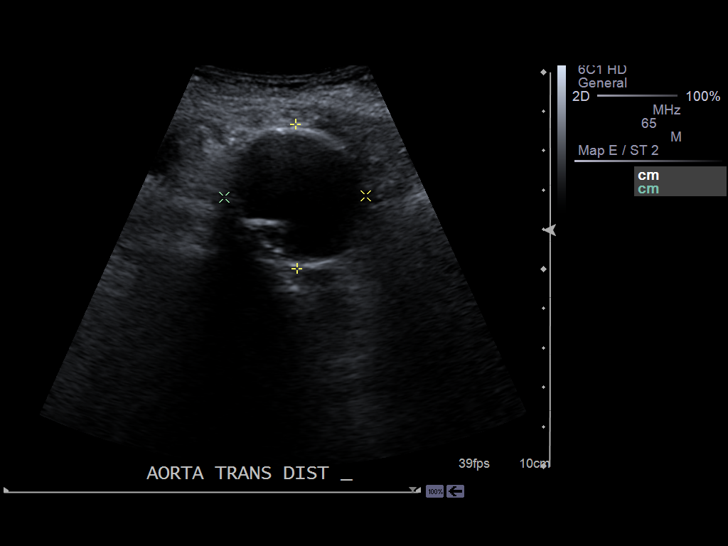
[im 10/14]
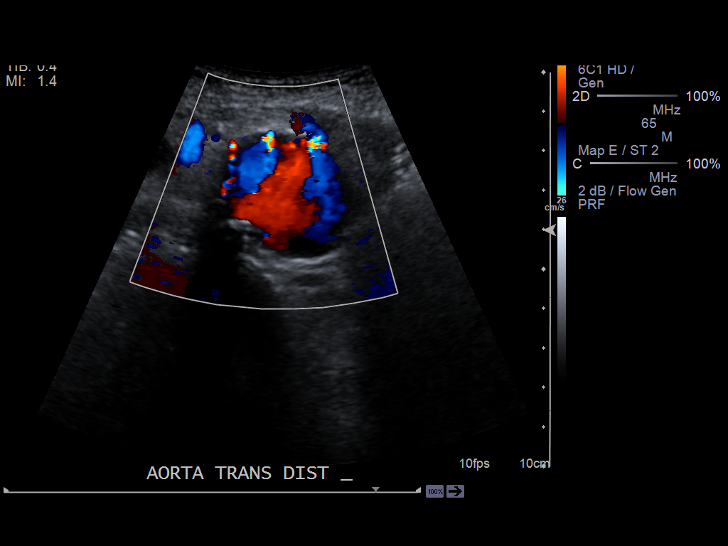
[im 11/14]
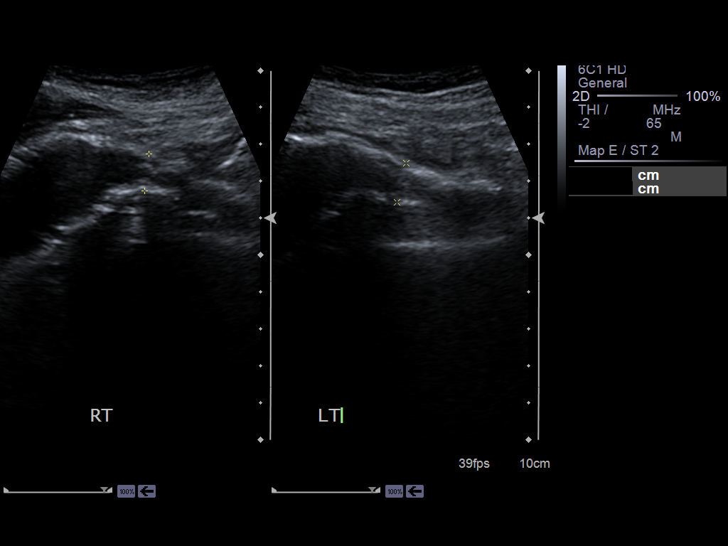
[im 12/14]
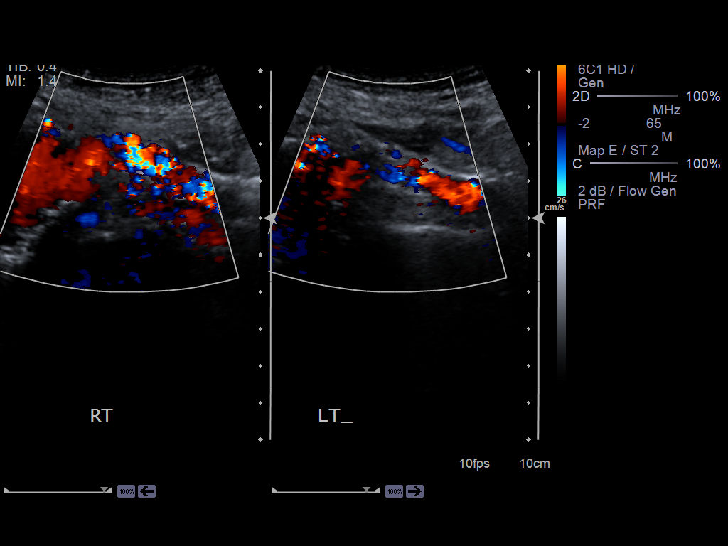
[im 13/14]
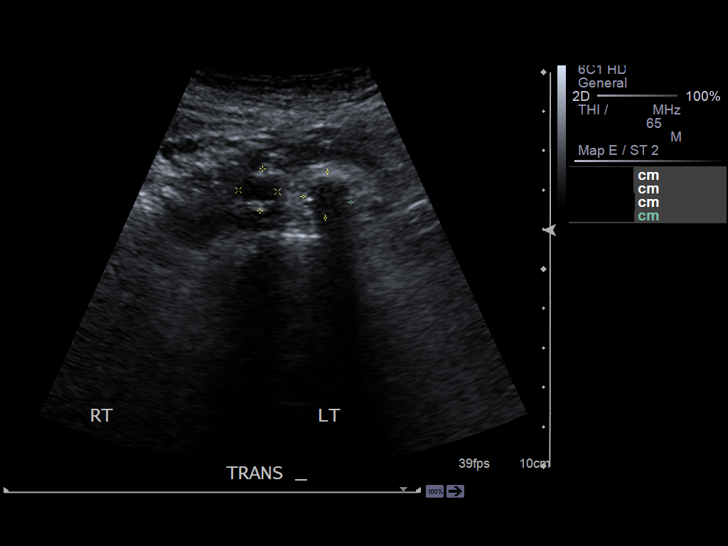
[im 14/14]
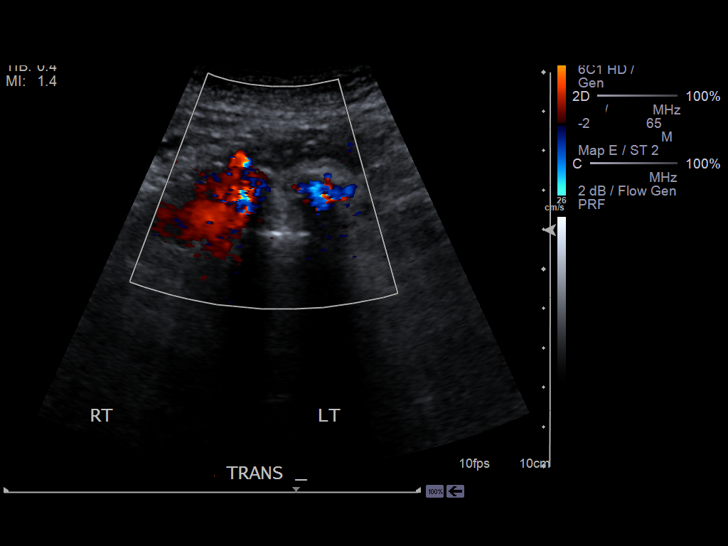

[14 of 14 positions shown; findings below may reference images not displayed]

PROCEDURE:     GILTIANA - GILTIANA AORTA  - October 14, 2011  [DATE]

RESULT:     The abdominal aorta exhibits an infrarenal aneurysm. This
exhibits a maximal AP dimension of 3.7 cm with maximal transverse dimension
of 3.6 cm. The proximal mid abdominal aorta exhibit maximal diameters of
and 2.6 cm respectively. The common iliac arteries are normal in caliber.
IMPRESSION: There is an infrarenal abdominal aortic aneurysm with
maximal measured dimension of 3.7 cm AP. There has been minimal interval
growth since the study April 06, 2010 and no significant  change since
the CT scan of 25 July, 2011.

[REDACTED]

## 2013-12-04 ENCOUNTER — Encounter (INDEPENDENT_AMBULATORY_CARE_PROVIDER_SITE_OTHER): Payer: Medicare Other

## 2013-12-04 DIAGNOSIS — I714 Abdominal aortic aneurysm, without rupture, unspecified: Secondary | ICD-10-CM

## 2013-12-10 ENCOUNTER — Encounter: Payer: Self-pay | Admitting: Internal Medicine

## 2013-12-10 ENCOUNTER — Ambulatory Visit (INDEPENDENT_AMBULATORY_CARE_PROVIDER_SITE_OTHER): Payer: Medicare Other | Admitting: Internal Medicine

## 2013-12-10 VITALS — BP 140/62 | HR 63 | Temp 97.9°F | Ht 62.5 in | Wt 111.5 lb

## 2013-12-10 DIAGNOSIS — Z0000 Encounter for general adult medical examination without abnormal findings: Secondary | ICD-10-CM

## 2013-12-10 DIAGNOSIS — D649 Anemia, unspecified: Secondary | ICD-10-CM

## 2013-12-10 DIAGNOSIS — E785 Hyperlipidemia, unspecified: Secondary | ICD-10-CM

## 2013-12-10 DIAGNOSIS — Z23 Encounter for immunization: Secondary | ICD-10-CM

## 2013-12-10 LAB — COMPREHENSIVE METABOLIC PANEL
ALBUMIN: 3.9 g/dL (ref 3.5–5.2)
ALT: 38 U/L — AB (ref 0–35)
AST: 52 U/L — ABNORMAL HIGH (ref 0–37)
Alkaline Phosphatase: 71 U/L (ref 39–117)
BUN: 25 mg/dL — ABNORMAL HIGH (ref 6–23)
CHLORIDE: 104 meq/L (ref 96–112)
CO2: 23 mEq/L (ref 19–32)
Calcium: 9.5 mg/dL (ref 8.4–10.5)
Creatinine, Ser: 1.3 mg/dL — ABNORMAL HIGH (ref 0.4–1.2)
GFR: 42.66 mL/min — ABNORMAL LOW (ref >60.00)
Glucose, Bld: 74 mg/dL (ref 70–99)
Potassium: 4.6 mEq/L (ref 3.5–5.1)
Sodium: 135 mEq/L (ref 135–145)
Total Bilirubin: 1 mg/dL (ref 0.2–1.2)
Total Protein: 7.9 g/dL (ref 6.0–8.3)

## 2013-12-10 LAB — CBC WITH DIFFERENTIAL/PLATELET
BASOS ABS: 0 10*3/uL (ref 0.0–0.1)
Basophils Relative: 0.6 % (ref 0.0–3.0)
Eosinophils Absolute: 0.3 10*3/uL (ref 0.0–0.7)
Eosinophils Relative: 5.3 % — ABNORMAL HIGH (ref 0.0–5.0)
HCT: 38.8 % (ref 36.0–46.0)
HEMOGLOBIN: 12.6 g/dL (ref 12.0–15.0)
LYMPHS PCT: 17.4 % (ref 12.0–46.0)
Lymphs Abs: 1.1 10*3/uL (ref 0.7–4.0)
MCHC: 32.6 g/dL (ref 30.0–36.0)
MCV: 96.2 fl (ref 78.0–100.0)
MONOS PCT: 11 % (ref 3.0–12.0)
Monocytes Absolute: 0.7 10*3/uL (ref 0.1–1.0)
Neutro Abs: 4.1 10*3/uL (ref 1.4–7.7)
Neutrophils Relative %: 65.7 % (ref 43.0–77.0)
PLATELETS: 226 10*3/uL (ref 150.0–400.0)
RBC: 4.03 Mil/uL (ref 3.87–5.11)
RDW: 14.2 % (ref 11.5–15.5)
WBC: 6.2 10*3/uL (ref 4.0–10.5)

## 2013-12-10 LAB — LIPID PANEL
Cholesterol: 172 mg/dL (ref 0–200)
HDL: 67.8 mg/dL (ref >39.00)
LDL Cholesterol: 80 mg/dL (ref 0–99)
NONHDL: 104.2
Total CHOL/HDL Ratio: 3
Triglycerides: 119 mg/dL (ref 0.0–149.0)
VLDL: 23.8 mg/dL (ref 0.0–40.0)

## 2013-12-10 LAB — MICROALBUMIN / CREATININE URINE RATIO
CREATININE, U: 183.2 mg/dL
MICROALB UR: 6.4 mg/dL — AB (ref 0.0–1.9)
Microalb Creat Ratio: 3.5 mg/g (ref 0.0–30.0)

## 2013-12-10 MED ORDER — ATORVASTATIN CALCIUM 40 MG PO TABS: 40.0000 mg | ORAL_TABLET | Freq: Every day | ORAL | Status: AC

## 2013-12-10 MED ORDER — WARFARIN SODIUM 3 MG PO TABS: ORAL_TABLET | ORAL | Status: AC

## 2013-12-10 MED ORDER — RAMIPRIL 5 MG PO CAPS: 5.0000 mg | ORAL_CAPSULE | Freq: Every day | ORAL | Status: DC

## 2013-12-10 MED ORDER — METOPROLOL SUCCINATE ER 25 MG PO TB24: ORAL_TABLET | ORAL | Status: AC

## 2013-12-10 MED ORDER — CLONAZEPAM 0.5 MG PO TABS: 0.5000 mg | ORAL_TABLET | Freq: Three times a day (TID) | ORAL | Status: DC | PRN

## 2013-12-10 MED ORDER — FLUOXETINE HCL 40 MG PO CAPS: 40.0000 mg | ORAL_CAPSULE | Freq: Every day | ORAL | Status: AC

## 2013-12-10 NOTE — Progress Notes (Signed)
Subjective:    Patient ID: Robin PancoastLois A [name], female    DOB: 1928/05/19, 80 y.o.   MRN: [id number]  HPI The patient is here for annual Medicare wellness examination and management of other chronic and acute problems.   The risk factors are reflected in the social history.  The roster of all physicians providing medical care to patient - is listed in the Snapshot section of the chart.  Activities of daily living:  The patient is 100% independent in all ADLs: dressing, toileting, feeding as well as independent mobility. Planning to [place] to [place].  Home safety : The patient has smoke detectors in the home. They wear seatbelts.  There are no firearms at home. There is no violence in the home.   There is no risks for hepatitis, STDs or HIV. There is no history of blood transfusion. They have no travel history to infectious disease endemic areas of the world.  The patient has seen their dentist in the last six month. [employer] They have not seen their eye doctor in the last year. [facility] Wears hearing aids every day. They have deferred audiologic testing in the last year.   They do not  have excessive sun exposure. Discussed the need for sun protection: hats, long sleeves and use of sunscreen if there is significant sun exposure. Derm - none  Diet: the importance of a healthy diet is discussed. They do have a healthy diet.  The benefits of regular aerobic exercise were discussed. She walk every other day 30min, and lifts weights daily.   Depression screen: there are no signs or vegative symptoms of depression- irritability, change in appetite, anhedonia, sadness/tearfullness.   Cognitive assessment: the patient manages all their financial and personal affairs and is actively engaged. They could relate day,date,year and events.  The following portions of the patient's history were reviewed and updated as appropriate: allergies, current medications, past family history,  past medical history,  past surgical history, past social history  and problem list.  Visual acuity was not assessed per patient preference since she has regular follow up with her ophthalmologist. Hearing and body mass index were assessed and reviewed.   During the course of the visit the patient was educated and counseled about appropriate screening and preventive services including : fall prevention , diabetes screening, nutrition counseling, colorectal cancer screening, and recommended immunizations.    Moving to [place] in November. Daughter lives in [place].  Review of Systems  Constitutional: Negative for fever, chills, appetite change, fatigue and unexpected weight change.  HENT: Positive for hearing loss.   Eyes: Negative for visual disturbance.  Respiratory: Negative for shortness of breath.   Cardiovascular: Negative for chest pain and leg swelling.  Gastrointestinal: Negative for nausea, vomiting, abdominal pain, diarrhea, constipation and blood in stool.  Musculoskeletal: Negative for arthralgias and myalgias.  Skin: Negative for color change and rash.  Hematological: Negative for adenopathy. Does not bruise/bleed easily.  Psychiatric/Behavioral: Negative for sleep disturbance and dysphoric mood. The patient is not nervous/anxious.        Objective:    BP 140/62  Pulse 63  Temp(Src) 97.9 F (36.6 C) (Oral)  Ht 5' 2.5" (1.588 m)  Wt 111 lb 8 oz (50.576 kg)  BMI 20.06 kg/m2  SpO2 94% Physical Exam  Constitutional: She is oriented to person, place, and time. She appears well-developed and well-nourished. No distress.  HENT:  Head: Normocephalic and atraumatic.  Right Ear: External ear normal. Decreased hearing is noted.  Left Ear: External ear normal. Decreased hearing is noted.  Nose: Nose normal.  Mouth/Throat: Oropharynx is clear and moist. No oropharyngeal exudate.  Wears hearing aids bilaterally  Eyes: Conjunctivae are normal. Pupils are equal, round, and  reactive to light. Right eye exhibits no discharge. Left eye exhibits no discharge. No scleral icterus.  Neck: Normal range of motion. Neck supple. No tracheal deviation present. No thyromegaly present.  Cardiovascular: Normal rate, regular rhythm and intact distal pulses.  Exam reveals no gallop and no friction rub.   Murmur heard. Pulmonary/Chest: Effort normal and breath sounds normal. No accessory muscle usage. Not tachypneic. No respiratory distress. She has no decreased breath sounds. She has no wheezes. She has no rales. She exhibits no tenderness. Right breast exhibits no inverted nipple, no mass, no nipple discharge, no skin change and no tenderness. Left breast exhibits no inverted nipple, no mass, no nipple discharge, no skin change and no tenderness. Breasts are symmetrical.  Abdominal: Soft. Bowel sounds are normal. She exhibits no distension and no mass. There is no tenderness. There is no rebound and no guarding.  Musculoskeletal: Normal range of motion. She exhibits no edema and no tenderness.  Lymphadenopathy:    She has no cervical adenopathy.  Neurological: She is alert and oriented to person, place, and time. No cranial nerve deficit. She exhibits normal muscle tone. Coordination normal.  Skin: Skin is warm and dry. No rash noted. She is not diaphoretic. No erythema. No pallor.  Psychiatric: She has a normal mood and affect. Her behavior is normal. Judgment and thought content normal.          Assessment & Plan:   Problem List Items Addressed This Visit     Unprioritized   Anemia   Relevant Orders      CBC with Differential   Hyperlipidemia   Relevant Medications      atorvastatin (LIPITOR) tablet      warfarin (COUMADIN) tablet      metoprolol succinate (TOPROL-XL) 24 hr tablet   Other Relevant Orders      Comprehensive metabolic panel      Lipid panel      Microalbumin / creatinine urine ratio   Medicare annual wellness visit, subsequent - Primary     General  medical exam including breast exam normal today. Mammogram UTD. PAP and pelvic deferred per pt age and preference. Colonoscopy is UTD. Will check labs today including CBC, CMP, lipids. Medications refilled. [place] is moving to [place] and we will forward records once she decides on physician there. Flu vaccine given today.        Return if symptoms worsen or fail to improve.

## 2013-12-10 NOTE — Patient Instructions (Signed)

## 2013-12-10 NOTE — Progress Notes (Signed)
Pre visit review using our clinic review tool, if applicable. No additional management support is needed unless otherwise documented below in the visit note. 

## 2013-12-10 NOTE — Addendum Note (Signed)
Addended by: Ronna PolioWALKER, JENNIFER A on: 12/10/2013 09:06 AM   Modules accepted: Orders

## 2013-12-10 NOTE — Assessment & Plan Note (Signed)
General medical exam including breast exam normal today. Mammogram UTD. PAP and pelvic deferred per pt age and preference. Colonoscopy is UTD. Will check labs today including CBC, CMP, lipids. Medications refilled. [place] to [place] and we will forward records once she decides on physician there. Flu vaccine given today.

## 2013-12-12 NOTE — Progress Notes (Signed)
LVM 10/14 

## 2013-12-19 ENCOUNTER — Ambulatory Visit (INDEPENDENT_AMBULATORY_CARE_PROVIDER_SITE_OTHER): Payer: Medicare Other

## 2013-12-19 DIAGNOSIS — I4891 Unspecified atrial fibrillation: Secondary | ICD-10-CM

## 2013-12-19 DIAGNOSIS — Z7901 Long term (current) use of anticoagulants: Secondary | ICD-10-CM

## 2013-12-19 DIAGNOSIS — Z5181 Encounter for therapeutic drug level monitoring: Secondary | ICD-10-CM

## 2013-12-19 LAB — POCT INR: INR: 3.2

## 2013-12-31 ENCOUNTER — Encounter: Payer: Self-pay | Admitting: Internal Medicine

## 2014-01-16 ENCOUNTER — Telehealth: Payer: Self-pay

## 2014-01-16 NOTE — Telephone Encounter (Signed)
Pt husband called, states pt needs order for coumadin check sent to [facility] [place], [place] [phone]. States pt has moved and does not have an appt until another 3 weeks, but needs to get her coumadin checked.

## 2014-01-16 NOTE — Telephone Encounter (Signed)
We will send this order for INR check to cover the due INR at this time. Spouse states they do have an appt at end of December for an appt with new MD. Order wrote and fax for INR results to come to us for this management.

## 2014-01-30 ENCOUNTER — Telehealth: Payer: Self-pay | Admitting: Cardiovascular Disease

## 2014-01-30 NOTE — Telephone Encounter (Signed)
Pt husband calling to see if we can fax over an authorization to hospital so they can do INR checks there. Please call patient  Fax [phone]

## 2014-01-30 NOTE — Telephone Encounter (Signed)
Which hospital? If she is in [place], I don't have privileges there. It will have to be signed by her new local cardiologist.

## 2014-01-31 ENCOUNTER — Ambulatory Visit (INDEPENDENT_AMBULATORY_CARE_PROVIDER_SITE_OTHER): Payer: Medicare Other | Admitting: Cardiology

## 2014-01-31 DIAGNOSIS — Z5181 Encounter for therapeutic drug level monitoring: Secondary | ICD-10-CM

## 2014-01-31 DIAGNOSIS — I4891 Unspecified atrial fibrillation: Secondary | ICD-10-CM

## 2014-01-31 LAB — PROTIME-INR: INR: 3.1 — AB (ref 0.9–1.1)

## 2014-02-01 ENCOUNTER — Telehealth: Payer: Self-pay | Admitting: Internal Medicine

## 2014-02-01 NOTE — Telephone Encounter (Signed)
New Message        Pt's husband calling stating he is checking on a form that was suppose to be faxed from the hospital in regards to pt's coumadin. Please call back and advise.

## 2014-02-01 NOTE — Telephone Encounter (Signed)
[facility] yesterday. Will forward to Coumadin clinic.

## 2014-02-01 NOTE — Telephone Encounter (Signed)
See Anticoag Note.

## 2014-04-08 ENCOUNTER — Other Ambulatory Visit: Payer: Self-pay

## 2014-04-08 MED ORDER — RAMIPRIL 5 MG PO CAPS: 5.0000 mg | ORAL_CAPSULE | Freq: Every day | ORAL | Status: AC

## 2014-04-08 NOTE — Telephone Encounter (Signed)
Only needs 5 pills, on vacation and forgot meds

## 2014-05-06 ENCOUNTER — Ambulatory Visit: Payer: Medicare Other | Admitting: General Surgery

## 2014-06-18 NOTE — Consult Note (Signed)
General Aspect The patient is an 81 year old Caucasian female with past medical history significant for history of CAD and atrial fibrillation s/p CABG and MAZE at [facility] in 2008 who presents with symptomatic afib with RVR.  She reports intermittent tachypalpitations over the past 2-3 days.  This am, she developed sustained palpitations with elevated heart rates. She denies CP but reports associated SOB.  She presented to [facility] where she was found to have afib with RVR.  Her atrial fibrillation was successfully terminated with IV cardizem.  Presently, she is resting comfortably and is without complaint. Review of EPIC reveals that she is followed by Dr Caryl Comes and Dr Fletcher Anon.  She most recently saw Dr Caryl Comes 10/19/11.  At that time, pacemaker interrogation revealed that she had not been having any atrial fibrillion.  Her pacemaker function at that time was normal.  Though she carries a h/o ischemic CM (with prior EF 10-15%), her most recent echo 7/13 revealed EF 50-55% at that time.    Present Illness Past Medical History   ???  Hypertension     ???  Abdominal aortic aneurysm         4.2 cm followed by Dr. Jamal Collin   ???  Hyperlipidemia     ???  CAD (coronary artery disease)         Severe 3 vessel CAD with severe ischemic cardiomyopathy in 12/2006. s/p CABG at [facility] by Dr. Rosana Hoes: LIMA to LAD, SVG to OM1, SVG to [facility] and SVG to PDA. cardiac cath 06/11 : patent grafts.    ???  CHF (congestive heart failure) w/ejection fraction 10% to 15%--> EF by echo 7/13 was 50-55% ???  Atrial fibrillation well controlled s/p MAZE 2008 ???  Left bundle branch block     ???  Sick sinus syndrome         s/p dual chamber pacemaker 01/2007 [employer]) ???  Mitral regurgitation        mild to moderate by echo 7/13 ???  Aortic stenosis         Mild to moderate AS with mild to moderate AI by echo 7/13  Past Surgical History   [employer] pacemaker  2008 implanted  @ [facility]   Coronary bypass graft stenosis with MAZE  01/27/07 @ [facility];Dr. Rosana Hoes   Total vaginal hysterectomy  1998   Cholecystectomy     Appendectomy     Breast lumpectomy       SOCIAL HISTORY: No smoking, alcohol abuse, or illicit drugs.   FAMILY HISTORY: No heart disease, diabetes, or hypertension.   MEDICATIONS:  1. Coumadin 3 mg daily except for 1.5 mg on Wednesdays. 2. Atorvastatin 40 mg p.o. daily.  3. Metoprolol 50 mg p.o. daily. 4. Ramipril 5 mg p.o. daily. 5. She has been off digoxin for the past 3 or 4 months now. 6. She is also taking Calcium with vitamin D once a day.  7. Fluoxetine 40 mg p.o. daily. 8. Multivitamins.   ALLERGIES: Codeine.   ROS: all systems reviewed and negative except as per HPI   Physical Exam:   GEN well developed, well nourished, no acute distress, elderly appearing    HEENT PERRL, Oropharynx clear    NECK supple    RESP normal resp effort  clear BS    CARD Regular rate and rhythm  2/6 SEM LUSB, 2/6 SEM at the apex    ABD denies tenderness  normal BS    LYMPH negative neck    EXTR negative cyanosis/clubbing,  negative edema    SKIN normal to palpation    NEURO motor/sensory function intact    PSYCH alert   Lab Results:  Thyroid:  25-Aug-13 09:56    Thyroid Stimulating Hormone 1.63 (0.45-4.50 (International Unit)  ----------------------- Pregnant patients have  different reference  ranges for TSH:  - - - - - - - - - -  Pregnant, first trimetser:  0.36 - 2.50 uIU/mL)  Hepatic:  25-Aug-13 09:56    Bilirubin, Total 0.8   Alkaline Phosphatase 99   SGPT (ALT) 46   SGOT (AST)  54   Total Protein, Serum 7.4   Albumin, Serum 3.8  TDMs:  25-Aug-13 09:56    Digoxin, Serum  < 0.1 (Therapeutic range for digoxin in patients with atrial fibrillation: 0.8 - 2.0 ng/mL. In patients with congestive heart failure a therapeutic range of 0.5 - 0.8 ng/mL is suggested as higher levels are associated with an increased risk of toxicity without clear evidence of enhanced  efficacy. Digoxin toxicity is commonly associated with serum levels > 2.0 ng/mL but may occur with lower levels, including those in the therapeutic range. Blood samples should be obtained 6-8 hours after administration to assure a reasonable volume of distribution.)  Routine Chem:  25-Aug-13 09:56    B-Type Natriuretic Peptide [facility])  2088 (Result(s) reported on 24 Oct 2011 at 11:48AM.)   Glucose, Serum 88   BUN  21   Creatinine (comp) 1.17   Sodium, Serum 136   Potassium, Serum 4.5   Chloride, Serum 106   CO2, Serum 22   Calcium (Total), Serum  8.4   Osmolality (calc) 274   eGFR (African American)  51   eGFR (Non-African American)  44 (eGFR values <55mL/min/1.73 m2 may be an indication of chronic kidney disease (CKD). Calculated eGFR is useful in patients with stable renal function. The eGFR calculation will not be reliable in acutely ill patients when serum creatinine is changing rapidly. It is not useful in  patients on dialysis. The eGFR calculation may not be applicable to patients at the low and high extremes of body sizes, pregnant women, and vegetarians.)   Anion Gap 8  Cardiac:  25-Aug-13 09:56    CK, Total 61   CPK-MB, Serum 0.7 (Result(s) reported on 24 Oct 2011 at 11:34AM.)   Troponin I < 0.02 (0.00-0.05 0.05 ng/mL or less: NEGATIVE  Repeat testing in 3-6 hrs  if clinically indicated. >0.05 ng/mL: POTENTIAL  MYOCARDIAL INJURY. Repeat  testing in 3-6 hrs if  clinically indicated. NOTE: An increase or decrease  of 30% or more on serial  testing suggests a  clinically important change)  Routine UA:  25-Aug-13 09:56    Color (UA) Colorless   Clarity (UA) Clear   Glucose (UA) Negative   Bilirubin (UA) Negative   Ketones (UA) Negative   Specific Gravity (UA) 1.004   Blood (UA) 1+   pH (UA) 5.0   Protein (UA) Negative   Nitrite (UA) Negative   Leukocyte Esterase (UA) Negative (Result(s) reported on 24 Oct 2011 at 02:28PM.)   RBC (UA) <1 /HPF   WBC (UA)  NONE SEEN   Bacteria (UA) NONE SEEN   Epithelial Cells (UA) NONE SEEN   Mucous (UA) PRESENT   Hyaline Cast (UA) 1 /LPF (Result(s) reported on 24 Oct 2011 at 02:28PM.)  Routine Coag:  25-Aug-13 09:56    Prothrombin  25.4   INR 2.3 (INR reference interval applies to patients on anticoagulant therapy. A single INR therapeutic range for  coumarins is not optimal for all indications; however, the suggested range for most indications is 2.0 - 3.0. Exceptions to the INR Reference Range may include: Prosthetic heart valves, acute myocardial infarction, prevention of myocardial infarction, and combinations of aspirin and anticoagulant. The need for a higher or lower target INR must be assessed individually. Reference: The Pharmacology and Management of the Vitamin K  antagonists: the seventh ACCP Conference on Antithrombotic and Thrombolytic Therapy. LAGTX.6468 Sept:126 (3suppl): [id number]. A HCT value >55% may artifactually increase the PT.  In one study,  the increase was an average of 25%. Reference:  "Effect on Routine and Special Coagulation Testing Values of Citrate Anticoagulant Adjustment in Patients with High HCT Values." American Journal of Clinical Pathology 2006;126:400-405.)  Routine Hem:  25-Aug-13 09:56    WBC (CBC) 5.3   RBC (CBC) 3.99   Hemoglobin (CBC) 12.5   Hematocrit (CBC) 37.0   Platelet Count (CBC) 207 (Result(s) reported on 24 Oct 2011 at 10:17AM.)   MCV 93   MCH 31.4   MCHC 33.8   RDW 14.3   EKG:   EKG Interp. by me  afib with RVR, LBBB    Codeine: N/V  Amoxicillin: Other  Vital Signs/Nurse's Notes: **Vital Signs.:   25-Aug-13 15:13   Vital Signs Type Admission   Temperature Temperature (F) 97.9   Celsius 36.6   Temperature Source oral   Pulse Pulse 60   Respirations Respirations 18   Systolic BP Systolic BP 032   Diastolic BP (mmHg) Diastolic BP (mmHg) 71   Mean BP 93   Pulse Ox % Pulse Ox % 97   Pulse Ox Activity Level  At rest   Oxygen  Delivery 2L; Nasal Cannula     Impression Pleaseant 81 yo WM with a h/o CAD and atrial fibrillation s/p CABG and surgical MAZE 2008 now presents with symptomatic afib with RVR.  Though she has previously had afib,  Dr Olin Pia note from 10/19/11 suggests that it has recently been well controlled.  She also carries a h/o cardiomyopathy, though by echo 7/13, her EF was 50-55%.    Plan 1.  Afib- appropriately coagulated with coumadin Given that her afib has previously been well controlled since her MAZE, I am reluctant to initiate antiarrhythmic medicine at this time.  Her options would be tikosyn or amiodarone.  I would prefer that we titrate her toprol (increase to $RemoveBefo'100mg'nkwOGdbDysK$  daly) and see how she does.  It may be that her afib will remain quiescent with this therapy alone.  If she has further afib then certainly an AAD would be necessary. I would not titrate digoxin as I do not think that this would be beneficial and as recent literature suggests may actually be harmful.  2. Bradycardia- s/p PPM at [employer]) in 2008.  I think that it would be prudent to have her pacemaker interrogated tomorrow am.  3. CAD/ ischemic CM-  echo pending, though echo 7/13 revealed improved EF to 50-55%. continue home medicine regimen at this time.  We will follow with you. If she remains in sinus rhythm and CMs are not significantly elevated, I would not anticipate significant cardiology workup or interventions required.   Electronic Signatures: Coralyn Mark (MD)  (Signed 25-Aug-13 17:27)  Authored: General Aspect/Present Illness, History and Physical Exam, Home Medications, Labs, EKG , Allergies, Vital Signs/Nurse's Notes, Impression/Plan   Last Updated: 25-Aug-13 17:27 by Coralyn Mark (MD)

## 2014-06-18 NOTE — Discharge Summary (Signed)
PATIENT NAME:  Robin Robin, Robin Robin MR#:  [id number] DATE OF BIRTH:  [birth year]  DATE OF ADMISSION:  10/24/2011 DATE OF DISCHARGE:  10/25/2011  ADMITTING DIAGNOSIS: Paroxysmal atrial fibrillation/rapid ventricular response.   DISCHARGE DIAGNOSES:  1. Atrial fibrillation, rapid ventricular response, converted to sinus rhythm now.  2. Hypertension. 3. Hyperlipidemia. 4. Congestive heart failure, left heart acute combined.   DISCHARGE CONDITION: Stable.   DISCHARGE MEDICATIONS: The patient is to resume her outpatient medications which are:  1. Fluoxetine 40 mg p.o. daily. 2. Atorvastatin 40 mg p.o. daily.  3. Ramipril 5 mg p.o. daily.  4. Multivitamin once daily.  5. Calcium with vitamin D 1 tablet daily. 6. Homeopathic vitamin B12, 2 sprays t.i.d. 7. Warfarin 3 mg on Mondays, Tuesdays, Thursdays, Fridays, Saturdays and Sundays, and 4.5 mg on Wednesdays.  8. Align 4 mg once daily.  9. Metoprolol succinate advance dose to 100 mg p.o. daily instead of 50 mg p.o. daily she was using in the past.   DIET: 2 grams salt, low fat, low cholesterol diet, regular consistency.   ACTIVITY LIMITATIONS: As tolerated.   FOLLOWUP:  1. Follow-up appointment with Dr. Ronna PolioJennifer Robin in 2 days.  2. Follow-up appointment with Dr. Kirke CorinArida in 1 week after discharge. .    CONSULTANTS:  1. Dr. Johney FrameAllred.  2. Dr. Kirke CorinArida.   LABORATORY, DIAGNOSTIC AND RADIOLOGICAL DATA: Chest x-ray, portable single view, on 10/24/2011 showed chronic obstructive pulmonary disease, also likely discoid atelectasis within lung bases as well as Robin Robin of underlying pulmonary fibrosis. No focal regions of consolidation were identified. Echocardiogram on 10/24/2011: Left ventricular systolic function is mildly reduced, ejection fraction of 40 to 45%. The left atrium is mildly dilated. There is moderate mitral regurgitation. There is mild tricuspid regurgitation. Right ventricular systolic pressure is elevated at 30 to 40 mmHg.  Mild-to-moderate valvular aortic stenosis, moderate aortic regurgitation noted. The patient's lab data showed elevated B-type natriuretic peptide of 2088, elevated BUN to 21, otherwise BMP was unremarkable. The patient's liver enzymes showed mild elevation of AST to 54. Cardiac enzymes, first set, as well as subsequent two more sets were within normal limits. TSH was normal at 1.63. The patient's digoxin level initially on arrival to the hospital was less than 0.1. CBC was within normal limits. INR was 2.3 with pro time of 25.4. Urinalysis was normal   HISTORY AND PHYSICAL: The patient is an 81 year old Caucasian female with past medical history significant for history of atrial fibrillation, who presented to the hospital with complaints of palpitations as well as shortness of breath. She was complaining of shortness of breath for the past few days, not able to sleep. Her heart rate was also noted to be racing earlier today. She presented to the hospital and her heart rate was low at 140s to 150s, and she was found to be in atrial fibrillation/RVR. She was given 20 mg of Cardizem, and her heart rate improved to 60s. No chest pains were reported.   On arrival to the hospital, the patient's temperature was normal at 98.3, pulse 140s, respiration rate 20, blood pressure 127/60. Saturation was 94% on oxygen therapy. Physical exam revealed crackles in bilateral bases as well as all lung fields, somewhat diminished breath sounds, but no wheezing was noted. The patient was not in overt respiratory distress.    HOSPITAL COURSE: The patient was admitted to the hospital for further evaluation. She had her metoprolol advanced to 100 mg p.o. daily dose. She was also given digoxin loading dose. With  this therapy she improved and her heart rate remained stable. She was seen by Robin cardiologist, Dr. Johney [name], initially on 10/24/2011. Dr. Johney [name] felt that the patient's atrial fibrillation is appropriately anticoagulated with  Coumadin therapy. Given that the patient's atrial fibrillation was previously well controlled, he felt that it is not the best idea to initiate antiarrhythmic medications at this time. According to Dr. Johney [name], the patient's options would include Tikosyn or amiodarone.  He recommended, however, to titrate her Toprol, increase to 100 mg p.o. daily dose, and see how she does. He felt that maybe with Robin higher dose of Toprol the patient's atrial fibrillation could remain quiescent with this therapy alone. If she, however, has further atrial fibrillation, certainly then an AAD would be necessary. He also did not recommend to continue digoxin as, according to Dr. Johney [name], it may be not beneficial but, in fact, may be harmful. In regards to bradycardia which the patient had in the past, he felt it would be prudent to interrogate her pacemaker as an outpatient, and that is what was decided by Dr. Kirke [name].   In regards to coronary artery disease and ischemic cardiomyopathy, the patient's echocardiogram was pending; and when Dr. Kirke [name]  saw the patient in consultation later on 10/25/2011, he recommended to continue the same therapy, not initiate her on digoxin, but continue Toprol-XL. Now, the pacemaker should be interrogated at Robin later time according to Dr. Jari Robin verbal report. Because her echocardiogram revealed somewhat diminished ejection fraction to 40 to 45%, he recommended to follow up with him in the next 1 to 2 weeks after discharge and up-titrate her ACE inhibitor, if possible.   The patient felt well on the day of discharge, did not have any significant complaints. She did not have any chest pains, and she remained stable. Her vital signs were good with temperature of 98, pulse 60s to 70s, respiratory rate was 16 to 18, blood pressure was 110 to 120/60s to 70s. Oxygen saturation was 93% on room air and 95% on 2 liters of oxygen through nasal cannula. As the patient was having crackles on her physical exam on  admission, she was given one dose of Lasix; however, Lasix was not recommended upon discharge. The patient is to continue ramipril for her congestive heart failure exacerbation. She is also to continue warfarin, and the patient was recommended to return back to Dr. Ronna Polio for Coumadin follow-up. The patient is being discharged in stable condition with the above-mentioned medications and follow-up.   TIME SPENT:   40 minutes. ____________________________ Katharina Caper, MD rv:cbb D: 10/25/2011 20:59:14 ET T: 10/26/2011 10:48:30 ET JOB#: [id number]  cc: Katharina Caper, MD, <Dictator> Ginette Pitman. Dan Humphreys, MD Chelsea Aus. Robin Corin, MD Katharina Caper MD ELECTRONICALLY SIGNED 10/30/2011 15:23

## 2014-06-18 NOTE — H&P (Signed)
PATIENT NAME:  Robin [name], Robin [name] MR#:  [id number] DATE OF BIRTH:  28-May-1928  DATE OF ADMISSION:  10/24/2011  PRIMARY CARE PHYSICIAN: Ronna Polio, MD   HISTORY OF PRESENT ILLNESS: The patient is an 81 year old Caucasian female with past medical history significant for history of AAA, history of acute gastroenteritis admission in May 2013 which at that time the patient also exhibited rectal bleed, history of bronchitis, coronary artery disease, and paroxysmal atrial fibrillation who presented to the hospital with complaints of shortness of breath as well as having palpitations. According to the patient, she was not doing too well over the past few days. She has been feeling tired as well as not sleeping well. She noted that her heart was racing earlier today. She denies any chest pains, however, admitted of some shortness of breath. She has been having shortness of breath for the past few days now. She denies any significant lower extremity swelling or orthopnea, however, admitted that she has been using one pillow as well as elevated head of the bed for a long period of time since her bypass surgery. She denies any chronic orthopnea symptoms or PND. Admits of having intermittent palpitations. Today she, however, became lightheaded as well as dizzy and very short of breath and that is why she decided to come to the Emergency Room for further evaluation. In the Emergency Room she was noted to be in atrial fibrillation/RVR with a rate of 140's to 150's. She was given 20 mg of Cardizem according to the Emergency Room physician, Dr. Shaune Pollack, which helped her heart rate to decrease to close to 60's and now she is in paced rhythm at rate of 60. The patient denies any discomfort at this moment.   PAST MEDICAL HISTORY:  1. History of admission May 2013 for acute gastroenteritis. At that time she also had rectal bleed.  2. History of cardiomyopathy with ejection fraction of 10 to 15%.  3. Paroxysmal atrial  fibrillation.  4. Coronary artery disease.  5. Congestive heart failure. 6. Left bundle branch block. 7. Sick sinus syndrome with pacemaker. 8. Hyperlipidemia. 9. Hypertension. 10. Abdominal aortic aneurysm, which apparently progressed recently significantly.  11. Echocardiogram done in June 2011 revealed normal chamber size and normal ejection fraction. Mild to moderate mitral regurgitation was noted as well as mild aortic regurgitation.   PAST SURGICAL HISTORY:  1. Pacemaker implantation. 2. Coronary artery bypass graft surgery.   SOCIAL HISTORY: No smoking, alcohol abuse, or illicit drugs.   FAMILY HISTORY: No heart disease, diabetes, or hypertension.   MEDICATIONS:  1. Coumadin 3 mg daily except for 1.5 mg on Wednesdays. 2. Atorvastatin 40 mg p.o. daily.  3. Metoprolol 50 mg p.o. daily. 4. Ramipril 5 mg p.o. daily. 5. She has been off digoxin for the past 3 or 4 months now. 6. She is also taking Calcium with vitamin D once a day.  7. Fluoxetine 40 mg p.o. daily. 8. Multivitamins.   ALLERGIES: Codeine.   REVIEW OF SYSTEMS: Positive for fatigue and weakness for the past few days, shortness of breath on exertion as well as at rest, not able to sleep well over the past few days, palpitations, arrhythmias as well as shortness of breath, lightheadedness and dizziness. Also, intermittent occasional diarrhea which seems to be chronic. Otherwise, denies any fevers, chills, pains, weight loss or gain. In regards to eyes, denies any blurry vision, double vision, glaucoma, or cataracts. ENT: Denies any tinnitus, allergies, epistaxis, sinus pain, dentures, or difficulty swallowing. RESPIRATORY: Denies any  cough, wheeze, asthma, or COPD. CARDIOVASCULAR: Denies any orthopnea or syncope. GASTROINTESTINAL: Denies any nausea, vomiting, hematemesis, rectal bleeding, or change in bowel habits. GENITOURINARY: Denies dysuria, hematuria, frequency, or incontinence. ENDOCRINOLOGY: Denies any polydipsia,  nocturia, thyroid problems, heat or cold intolerance, or thirst. HEMATOLOGIC: Denies any anemia, easy bruising, bleeding, or swollen glands. SKIN: Denies any acne, rashes, lesions, or change in moles. MUSCULOSKELETAL: Denies arthritis, cramps, swelling. NEUROLOGIC: Denies any numbness, epilepsy, or tremor. PSYCHIATRIC: Denies anxiety, insomnia, or depression.   PHYSICAL EXAMINATION:   VITAL SIGNS: On arrival to the hospital, temperature 98.3, pulse 104, however, increasing to 140's to 150's intermittently according to EKG, respiration rate 20, blood pressure 127/60, saturation was 94% on oxygen therapy.   GENERAL: This is a well developed, well nourished somewhat anxious Caucasian female sitting on the stretcher.   HEENT: Pupils are equal and reactive to light. Extraocular movements intact. No icterus or conjunctivitis. Has normal hearing. No oropharyngeal erythema. Mucosa is moist.   NECK: Neck did not reveal any masses. Supple, nontender. Thyroid not enlarged. No adenopathy. No JVD or carotid bruits bilaterally. Full range of motion.   LUNGS: Crackles bilaterally in the bases as well as all lung fields. There seemed to be dry crackles. Somewhat diminished breath sounds but no wheezing. No labored inspirations or increased effort. No dullness to percussion. Not in overt respiratory distress.   CARDIOVASCULAR: S1, S2 appreciated. The patient does have two different murmurs, one at aortic auscultation site, blowing systolic murmur radiating to the neck and the other one is mitral auscultation site radiating to the axilla. Chest is nontender to palpation. 1+ pedal pulses. No lower extremity edema. No clubbing or cyanosis noted.   ABDOMEN: Soft, nontender. Bowel sounds are present. No hepatosplenomegaly or masses were noted.   RECTAL: Deferred.   MUSCULOSKELETAL: Muscle strength able to move all extremities. No cyanosis, degenerative joint disease, or kyphosis. Gait is not tested.   SKIN: Skin did  not reveal any rashes, lesions, erythema, nodularity, or induration. It was warm and dry to palpation.   LYMPH: No adenopathy in the cervical region.   NEUROLOGIC: Cranial nerves grossly intact. Sensory is intact. No dysarthria or aphasia. The patient is alert and oriented to time, person, and place, cooperative. Memory is good.   PSYCHIATRIC: No significant confusion, agitation, or depression noted.  EKG telemetry showed atrial fibrillation with nonspecific intraventricular block, cannot rule out inferior infarct, age undetermined. Repeated EKG intermittently showed paroxysmal atrial fibrillation, rate as high as 140's to 150's. After 20 mg of Cardizem the patient remains in atrial fibrillation, however, she has slowed down. EKGs were done in the Emergency Room, two of them. The first one around 9:41 a.m. revealed demand pacemaker interpretation intrinsic rhythm which was atrial fibrillation, rate 123 beats per minute, normal axis, aberrantly conducted complexes, left bundle branch block, nonspecific ST-T changes were noted. Repeated EKG was done at around 10:30 a.m. and showed electronic atrial pacemaker with left bundle branch block at rate of 60.   Chest x-ray according to the radiologist, portable, single view, 10/24/2011, revealed COPD as well as discoid atelectasis within lung bases as well as component of underlying pulmonary fibrosis. No focal regions of consolidation were identified.   ASSESSMENT AND PLAN:  1. Atrial fibrillation/RVR, paroxysmal, very likely related to shortness of breath as well as hypoxia. Continue Toprol. May be able to advance it. Will also get the patient's digoxin back. Will get an echocardiogram repeated. Check cardiac enzymes x3. The patient, however, had no chest  pain. Will also continue Coumadin therapy. Will get cardiologist involved and get echo  as mentioned above.  2. Hypertension. Will continue current medications, seems to be well controlled.   3. Hyperlipidemia. Continue Lipitor.  4. COPD, fibrosis, questionable CHF. Get BNP. Get PFTs also. The patient may benefit from getting oxygen saturations on room air at rest as well as on exertion, questionable at nighttime to qualify her for home oxygen if needed.   TIME SPENT: 50 minutes.   ____________________________ Katharina Caperima Ysenia Filice, MD rv:drc D: 10/24/2011 13:31:51 ET T: 10/24/2011 14:10:03 ET JOB#: [id number]  cc: Katharina Caperima Torian Thoennes, MD, <Dictator>, Ginette PitmanJennifer A. Dan HumphreysWalker, MD Katharina CaperIMA Reighan Hipolito MD ELECTRONICALLY SIGNED 10/30/2011 15:23

## 2014-06-23 NOTE — H&P (Signed)
PATIENT NAME:  Robin [name], [name] MR#:  [id number] DATE OF BIRTH:  10-Dec-1928  DATE OF ADMISSION:  07/25/2011  PRIMARY CARE PHYSICIAN: Dr. Yves DillNeelam Khan  REFERRING PHYSICIAN: Dr. Clemens Catholicagsdale  CHIEF COMPLAINT: Abdominal pain, nausea, vomiting, diarrhea for three days.   HISTORY OF PRESENT ILLNESS: Patient is an 81 year old Caucasian female with a history of coronary artery disease status post CABG, congestive heart failure with ejection fraction 10% to 15%, chronic atrial fibrillation, left bundle branch block, hypertension, hyperlipidemia presented to the ED with abdominal pain, nausea, vomiting, diarrhea for three days. In addition, patient has decreased appetite, feels weak, thirsty. He also complains of fever and chills but denies any chest pain, palpitations. No dysuria, hematuria. The abdominal pain is diffuse, intermittent. She also has loose stool. She was noticed to have blood in the stool in ED by Dr. Clemens Catholicagsdale so he got a CAT scan of abdomen which showed abdominal aortic aneurysm about 4.3 x 3.6 cm. Dr. Clemens Catholicagsdale contacted Dr. Wyn Quakerew, vascular surgeon, who recommended follow up as outpatient.   PAST MEDICAL HISTORY:  1. Coronary artery disease.  2. Congestive heart failure. 3. Chronic atrial fibrillation. 4. Left bundle-branch block.  5. Sick sinus with pacemaker. 6. Hyperlipidemia. 7. Hypertension. 8. Abdominal aortic aneurysm.  PAST SURGICAL HISTORY:  1. Pacemaker implantation. 2. Coronary artery bypass graft.   SOCIAL HISTORY: Denies any smoking, alcohol drinking, or illicit drugs.   FAMILY HISTORY: No heart disease, diabetes or hypertension.   ALLERGIES: Codeine.   MEDICATIONS:  1. Atorvastatin  40 mg p.o. daily.  2. Calcium 600 + D 1 tablet p.o. daily.  3. Digoxin 125 mcg p.o. daily. 4. Fluoxetine 40 mg p.o. daily. 5. Homeopathic vitamin B12 sublingual spray two sprays t.i.d.  6. Lopressor ER 100 mg p.o. daily.  7. Once a day woman's 50+ tablets, 1 tablet daily. 8. Ramipril 5  mg p.o. daily.  9. Warfarin 1 mg p.o. 0.5 mg once a day along with 3 mg tablets, total 3.5 mg total warfarin 3 mg p.o. tablets 1 tablet once a day along with 1/2 tablet 1 mg tablets total of 3.5 mg total.    REVIEW OF SYSTEMS: CONSTITUTIONAL: CONSTITUTIONAL: Patient has fever, chills, weakness but no headache or dizziness. EYES: No double vision, blurred vision. ENT: No postnasal drip, epistaxis but has hearing loss, but no dysphagia. RESPIRATORY: No cough, sputum, shortness of breath, or hemoptysis. CARDIOVASCULAR: No chest pain, palpitation, orthopnea, nocturnal dyspnea. No leg edema. GASTROINTESTINAL: Positive for abdominal pain, nausea, vomiting, diarrhea but has bloody stool. No melena. GENITOURINARY: No dysuria, hematuria, or incontinence. ENDOCRINE: No polyuria or polydipsia. No heat or cold intolerance. HEMATOLOGY: Positive for bruising but no active bleeding. NEUROLOGY: No syncope, loss of consciousness or seizure.   PHYSICAL EXAMINATION:  VITALS: Temperature 96.6, blood pressure 121/56, pulse 78, respirations 19, oxygen saturation 96% on room air.   GENERAL: Patient is alert, awake, oriented in no acute distress.   HEENT: Pupils round, equal, reactive to light, accommodation.   NECK: Supple. No JVD or carotid bruits. No lymphadenopathy. No thyromegaly. Moist oral mucosa. Clear oropharynx.   CARDIOVASCULAR: S1, S2 regular rate, rhythm. There is a systolic murmur 2 to 3/6 but no gallop.   PULMONARY: Bilateral air entry. No wheezing, rales.   ABDOMEN: Soft. No distention but has diffuse tenderness No organomegaly.   EXTREMITIES: No edema, clubbing, or cyanosis. No calf tenderness. Strong bilateral pedal pulses.   SKIN: Some bruises on right lower extremity.   NEUROLOGY: Alert and oriented x3. No focal  deficits. Power 5/5. Sensation intact. Deep tendon reflexes 2+.   LABORATORY, DIAGNOSTIC AND RADIOLOGICAL DATA: PT 22.3, INR 1.9, PTT 42.3, CK 48, CK-MB 1.5, troponin less than 0.02.  Chest x-ray: No acute abnormality. Urinalysis negative. Hemoglobin 13.6, WBC 5.2, platelets 225, glucose 100, BUN 31, creatinine 1.54, sodium 139, potassium 4.0, bicarbonate 21. CAT scan of abdomen showed prominent abdominal aortic aneurysm. Mild adynamic ileus. EKG shows electronic ultra pacemaker with left bundle branch block.   IMPRESSION:  1. Possible acute gastroenteritis. Need to rule out Clostridium difficile colitis.  2. Occult GI bleeding.  3. Acute renal failure, dehydration.  4. Hypertension, controlled.  5. History of congestive heart failure, atrial fibrillation, hyperlipidemia, stable.  6. Abdominal aortic aneurysm.   PLAN OF TREATMENT:  1. The patient will be placed for observation. We will IV fluid with normal saline for gentle rehydration and will give Zofran, Protonix and follow-up BMP.  2. For occult GI bleeding, will hold Coumadin and follow up CBC, stool culture, Clostridium difficile, CDT and follow up GI, Dr. Niel Hummer.  3. For abdominal aortic aneurysm, according to Dr. Wyn Quaker patient can be followed up as outpatient.   Discussed the patient's situation and plan of treatment with patient and patient's daughter.     TIME SPENT: About 65 minutes.   ____________________________ Shaune Pollack, MD qc:cms D: 07/25/2011 16:39:28 ET T: 07/26/2011 05:44:14 ET JOB#: [id number]  cc: Shaune Pollack, MD, <Dictator> Margaretann Loveless, MD Shaune Pollack MD ELECTRONICALLY SIGNED 07/26/2011 21:01

## 2014-06-23 NOTE — Discharge Summary (Signed)
PATIENT NAME:  Robin [name], Robin [name] MR#:  [id number] DATE OF BIRTH:  1928-11-12  DATE OF ADMISSION:  07/25/2011 DATE OF DISCHARGE:  07/27/2011  ADMISSION DIAGNOSES: Abdominal pain, nausea, vomiting, and diarrhea.   DISCHARGE DIAGNOSES:  1. Abdominal pain, nausea, vomiting and diarrhea likely due to acute gastroenteritis, now resolved. Also could be possible antibiotic associated diarrhea. Now her symptoms have resolved.  2. One episode of very small amount of bright red blood per rectum. Hemoglobin has been stable. Seen by gastroenterology and had had a colonoscopy. No further plan for any intervention at this time. Prominent abdominal aortic aneurysm noted. The patient has been followed by Dr. Evette Cristal. There is a note of increase in the size of the AAA. She will need to follow up with Dr. Evette Cristal to decide whether this needs any surgical intervention in the near future.  3. Recent bronchitis.  4. History of myocardial infarction in the past with coronary artery disease.  5. History of paroxysmal atrial fibrillation.  6. History of congestive heart failure.  7. Abdominal aortic aneurysm which was 3.6 cm on ultrasound in February 2013. CT scan of the chest here shows the size to be 4.3 cm in size.  8. Status post pacemaker placement.  9. Status post coronary artery bypass graft.  PERTINENT LABS/STUDIES: Lipase 132 on admission. Glucose 100, BUN 31, creatinine 1.54, sodium 139, potassium 4.0, chloride 109, and CO2 21. LFTs were normal. WBC 5.2, hemoglobin 13.6, and platelet count 225.   Urinalysis: Nitrites negative, leukocytes negative.   EKG showed electronic atrial pacemaker.   Chest x-ray, PA and lateral, showed no acute abnormality.   INR 1.9.  CT of the abdomen showed prominent abdominal aortic aneurysm, mild adynamic ileus.   Stool for C. difficile was negative. Stool for Salmonella and Shigella was negative.   Hemoglobin today is 11.5. INR 2.1.   HISTORY/HOSPITAL COURSE: Please refer  to the history and physical done by the admitting physician for further details. In brief, the patient is an 81 year old white female with history of coronary artery disease status post CABG, congestive heart failure with an ejection fraction of 10 to 15%, chronic atrial fibrillation, left bundle branch block, hypertension, and hyperlipidemia who presented to the ED with abdominal pain, nausea, vomiting, and diarrhea for three days. The patient also had decrease in appetite and felt very weak and thirsty. Due to these symptoms, she had a CT scan of the abdomen done in the ED and was noted to have an abdominal aneurysm of 4.3 x 3.6 cm. The ED physician spoke to Dr. Wyn Quaker, vascular surgeon, regarding this. He recommended outpatient follow up. The patient however normally follows with Dr. Evette Cristal who has been monitoring her aneurysm. She was also recently on antibiotics. Therefore, there was concern that she may have C. difficile or antibiotic-associated diarrhea, so we were asked to admit the patient. The patient was admitted for supportive care. The patient's diarrhea started to improve by day two. She was seen also by surgery because of aneurysm, Dr. Jovita Gamma, who recommended outpatient follow-up. She was seen by Dr. Mechele Collin because there was some concern that she had some bright red blood in her stool. The patient had had a colonoscopy and no further interventions were done. Her Coumadin has been continued. Her hemoglobin did drop a little, but that may have been due to IV hydration. Her hemoglobin has remained stable over the last 24 hours, no further bleeding. Her diarrhea is resolved. Stool studies are negative for Clostridium difficile  or any other bacterial pathogens. At this time, she is stable for discharge.   DISCHARGE MEDICATIONS:  1. Digoxin 125 mcg daily.  2. Fluoxetine 40 mg daily.  3. Atorvastatin 40 mg daily.  4. Metoprolol succinate 200 mg daily.  5. Ramipril 5 mg daily.  6. Warfarin 3.5 mg  daily. 7. One-A-Day Women's daily, multivitamin.  8. Calcium plus vitamin D daily.   HOME OXYGEN: None.   DIET: Low sodium.   ACTIVITY: As tolerated.   DISCHARGE FOLLOWUP: Followup with Dr. Yves DillNeelam Khan, her primary physician, in 1 to 2 weeks, and with Dr. Evette CristalSankar for abdominal aortic aneurysm in 2 to 3 weeks.   TIME SPENT ON DISCHARGE: 35 minutes. ____________________________ Lacie ScottsShreyang H. Allena KatzPatel, MD shp:slb D: 07/27/2011 12:11:36 ET T: 07/28/2011 11:39:23 ET JOB#: [id number]  cc: Shley Dolby H. Allena KatzPatel, MD, <Dictator> Margaretann LovelessNeelam S. Khan, MD Charise CarwinSHREYANG H Sterling Ucci MD ELECTRONICALLY SIGNED 07/29/2011 13:38

## 2014-06-23 NOTE — Consult Note (Signed)
PATIENT NAME:  Robin [name], Robin [name] MR#:  [id number] DATE OF BIRTH:  04-13-1928  DATE OF CONSULTATION:  07/25/2011  REFERRING PHYSICIAN:   CONSULTING PHYSICIAN:  Kahmya Pinkham S. Carnita Golob, MD  HISTORY OF PRESENT ILLNESS: This patient was seen by me when the Emergency Room  called me that she was here with abdominal pain, nausea, and vomiting. Emergency physician also thought that she also has aneurysm of the abdomen for which she is being monitored by ultrasound and now today she came in with the upper and lower abdominal pain for the last two days with some nausea and vomiting. The patient says she is unable to keep any food down. The quality of the pain was burning and cramping in nature and it becomes worse with vomiting. The patient also has yellowish colored vomiting. She has brown color stool with some red tinge in it. She is also on Coumadin because of atrial fibrillation. She never had these symptoms before.   REVIEW OF SYSTEMS: The patient has [name] fever of 100.3. She has some abdominal pain which is cramping in nature. She is also having some nausea and vomiting. Cardiovascular-wise she is having some palpitations and some cough. She has dark urine but no blood in the urine.   PAST MEDICAL HISTORY:  1. History of MI in the past.  2. Atrial fibrillation. 3. Congestive heart failure. 4. Abdominal aortic aneurysm 3.6 cm on ultrasound on 02/13 which is being followed by Dr. Evette CristalSankar.  5. Hypertension.  6. Left bundle branch block. 7. Sick sinus pacemaker. 8. Chronic atrial fibrillation. 9. Hyperlipidemia.   PAST SURGICAL HISTORY:  1. Appendectomy.  2. Cardiac bypass. 3. Cholecystectomy. 4. Cataract surgery. 5. The patient has [name] pacemaker in.   PHYSICAL EXAMINATION:   GENERAL: She is laying comfortably in the bed.   ABDOMEN: The abdomen is soft. She is slightly tender in the epigastric region, some tenderness in the suprapubic area. No tenderness in the renal angles. There is no guarding in the  abdomen. I really do not think she has [name] ruptured aortic aneurysm although I reviewed the CAT scan of the abdomen and pelvis and she has an aneurysm but there is no free blood in the retroperitoneal space as well as into the abdomen.   NECK: Normal.   RESPIRATIONS: Normal.   CARDIOVASCULAR: Atrial fibrillation. Heart sounds normal.   PELVIC: Not done.   RECTAL: Positive heme, positive stools, nontender.   SKIN: Warm, dry. Color normal.   EXTREMITIES: Nontender.   NEUROLOGIC: She is alert. She gave full history to me.   LABORATORY, DIAGNOSTIC, AND RADIOLOGICAL DATA: EKG showed normal sinus rhythm and atrial pacer, left bundle branch block.  Creatinine 1.19, chloride 109, INR 1.9. She is also on Coumadin.  CAT scan of the abdomen showed adynamic ileus, nothing else.   CLINICAL IMPRESSION: Probably gastroenteritis, remote possibility of GI bleed which does not seem to be. The following things need to be done.  1. This patient will be admitted on house medicine service because of [name] lot of medical problems.  2. I do not think it is an acute emergency as surgical condition is concerned. There is no evidence of diverticulitis. No evidence of any bowel obstruction. I think it would be [name] good idea to have vascular consult. Emergency Room physician was going to talk to the vascular person on call.   I will notify Dr. Evette CristalSankar in the morning and hopefully she gets better and she will be able to go home.  ____________________________ Alton Revere Cecelia Byars, MD msh:drc D: 07/25/2011 17:12:16 ET T: 07/26/2011 08:29:57 ET JOB#: [id number]  cc: Juaquin Ludington S. Cecelia Byars, MD, <Dictator> Kathreen Cosier, MD  Meryle Ready MD ELECTRONICALLY SIGNED 07/29/2011 12:31

## 2014-06-23 NOTE — Consult Note (Signed)
PATIENT NAME:  Robin [name], Robin [name] MR#:  [id number] DATE OF BIRTH:  13-Aug-1928  DATE OF CONSULTATION:  07/26/2011  REFERRING PHYSICIAN:   CONSULTING PHYSICIAN:  Scot Jun, MD  HISTORY OF PRESENT ILLNESS: Patient is an 81 year old white female who was seen at [facility] a few days ago. She had some swollen glands in her neck, some tenderness and was given amoxicillin. She then developed diarrhea and vomiting on Saturday and came to the hospital yesterday and was admitted. I was asked to see her in consultation.   Patient was seen in the ER and noticed to have blood in the stools. A CAT scan of the abdomen showed abdominal aortic aneurysm 4.3 cm x 3.6 cm. She is also followed by Dr. Evette Cristal for this.   PAST MEDICAL HISTORY:  1. Coronary artery disease. 2. Congestive heart failure. 3. Chronic atrial fibrillation. 4. Left bundle branch block. 5. Sick sinus syndrome with pacemaker. 6. Hypertension. 7. Hyperlipidemia. 8. Abdominal aortic aneurysm.   PAST SURGICAL HISTORY:  1. CABG.  2. Pacemaker implantation.   SOCIAL HISTORY: Does not smoke or drink.   ALLERGIES: Codeine.   MEDICATIONS ON ADMISSION:  1. Atorvastatin 40 mg a day.  2. Calcium 600 with vitamin D one a day.  3. Digoxin 0.125 mg a day.  4. Fluoxetine 40 mg a day.  5. Vitamin B12 sublingual spray. 6. Lopressor ER 100 mg a day.  7. Ramipril 5 mg a day.  8. Coumadin 3.5 mg a day on some days and 3 mg a day on other days.   REVIEW OF SYSTEMS: She had chills, fever, nausea, vomiting and diarrhea. This is all better now. She has had only one stool today which she was able to give a specimen for. No fever or chills. No cough or sputum production. No chest pains.   PHYSICAL EXAMINATION:  GENERAL: Elderly white female in no acute distress.  VITAL SIGNS: Temperature 96.2, pulse 70, blood pressure 120/62, pulse oximetry 93% on room air.   GENERAL: Elderly white female no acute distress. Sclera nonicteric. Conjunctivae  negative. Tongue negative.   NECK: No thyromegaly.   CHEST: Clear.   HEART: 2 to 3/6 systolic murmur.   ABDOMEN: There is some left mid abdominal tenderness, left lower abdominal tenderness.   EXTREMITIES: No edema.   SKIN: Warm and dry.   PSYCHIATRIC: Mood and affect are appropriate.   LABORATORY, DIAGNOSTIC AND RADIOLOGICAL DATA: BUN on admission 31, after hydration down to 23. Creatinine 1.54 on admission, after hydration 1.15. Sodium 138, potassium 4, chloride 107, CO2 22, calcium 8.3, magnesium 1.6, lipase 132. Liver panel normal. CPK, MB and troponins negative. White count 4.2, hemoglobin 11.7, hematocrit 35.5, platelet count 172, pro time 22.3, PTT 42.3. Urinalysis shows obvious infection with leukocyte esterase, trace positive, a few white cells per high-power field. CAT scan shows aneurysm as mentioned above. Patient's last colonoscopy was 2011 with Dr. Niel Hummer; fresh blood noted in the ascending colon with scattered red spots consistent with vascular anomalies, less likely is colitis. Biopsies were obtained. This was done on 10/23/2009. Upper endoscopy performed showed gastritis; this was done 02/11/2010. Previous colonoscopy before that was done by Dr. Sharyne Peach May 2006 showed a 9 mm polyp in the sigmoid colon.   RECOMMENDATIONS: I would think that this probably was a viral gastroenteritis which has improved on its own. Given the fact that she had a colonoscopy just two years ago and she is improving I would at this time not recommend a  colonoscopy. I believe her fall in hemoglobin likely was due to hydration. I definitely would wait on the stool cultures and the C. difficile toxin and see what they show. Given her lack of active bleeding you could go ahead with the Coumadin for now at her usual dose. Will follow with you.  ____________________________ Scot Junobert T. Kenady Doxtater, MD rte:cms D: 07/26/2011 20:23:41 ET T: 07/27/2011 05:42:07 ET JOB#: [id number]  cc: Scot Junobert T. Mekenzie Modeste, MD,  <Dictator> Margaretann LovelessNeelam S. Khan, MD Kathreen CosierS.G. Sankar, MD  Scot JunOBERT T Mansur Patti MD ELECTRONICALLY SIGNED 08/06/2011 17:50

## 2014-06-23 NOTE — Consult Note (Signed)
Pt feeling better after coming in hospital and diarrhea is much better.  Last colonoscopy was 2 years ago so no strong need to do another one at this time.  Exam positive for LLQ tendernss.  Await stool studies.   Electronic Signatures: Scot JunElliott, Robert T (MD)  (Signed on 27-May-13 20:25)  Authored  Last Updated: 27-May-13 20:25 by Scot JunElliott, Robert T (MD)

## 2014-07-22 ENCOUNTER — Other Ambulatory Visit: Payer: Self-pay | Admitting: Cardiovascular Disease

## 2014-07-22 ENCOUNTER — Other Ambulatory Visit: Payer: Self-pay | Admitting: Internal Medicine

## 2014-07-22 NOTE — Telephone Encounter (Signed)
Last OV 10.12.15, last refill 10.30.15.  Please advise refill

## 2014-07-23 NOTE — Telephone Encounter (Signed)
rx faxed

## 2014-08-30 ENCOUNTER — Other Ambulatory Visit: Payer: Self-pay | Admitting: Internal Medicine

## 2014-08-30 NOTE — Telephone Encounter (Signed)
Is she taking this regularly?  If taking regularly - ok to refill x 1, but needs office visit with Dr Dan HumphreysWalker.  Last visit 11/2013.

## 2014-08-30 NOTE — Telephone Encounter (Signed)
Last OV 10.12.15, last refill 5.24.16.  Please advise refill in Dr Sonny DandyWalkers absence.

## 2014-09-03 NOTE — Telephone Encounter (Signed)
Left message on VM to return call 

## 2014-09-05 NOTE — Telephone Encounter (Signed)
Please try again to reach.   If unable to reach, can forward to Dr Dan HumphreysWalker and see if she wants to refill.  Thanks

## 2014-09-05 NOTE — Telephone Encounter (Signed)
Called pt again today, still no answer.  Please advise refill

## 2014-09-06 NOTE — Telephone Encounter (Signed)
Rx faxed

## 2014-10-26 ENCOUNTER — Other Ambulatory Visit: Payer: Self-pay | Admitting: Internal Medicine

## 2014-10-28 NOTE — Telephone Encounter (Signed)
rx faxed

## 2014-10-28 NOTE — Telephone Encounter (Signed)
Last OV 10.12.15.  Please advise refill 

## 2014-11-18 ENCOUNTER — Encounter: Payer: Self-pay | Admitting: Internal Medicine

## 2014-12-05 ENCOUNTER — Telehealth: Payer: Self-pay | Admitting: *Deleted

## 2014-12-05 NOTE — Telephone Encounter (Signed)
Lmom to call our office. Time to schedule a carotid u/s (1yr f/u). 

## 2014-12-26 ENCOUNTER — Telehealth: Payer: Self-pay | Admitting: *Deleted

## 2014-12-26 NOTE — Telephone Encounter (Signed)
Pt husband called stating that they no longer live near by and won't be following up with us.

## 2015-06-02 IMAGING — US ULTRASOUND RETROPERITONEAL COMPLETE
1 series · 14 of 25 positions shown · non-contrast
Comparison: 05/04/2012

CLINICAL DATA: Known abdominal aortic aneurysm follow-up.

EXAM:
RETROPERITONEAL ULTRASOUND COMPLETE
TECHNIQUE: Ultrasound examination of the abdominal aorta was performed to
evaluate for abdominal aortic aneurysm. The common iliac arteries,
IVC, and kidneys were also evaluated.

[Series 1: ultrasound retroperitoneal complete · 0.23mm/px · 14 of 43 slices shown]
[im 1/43]
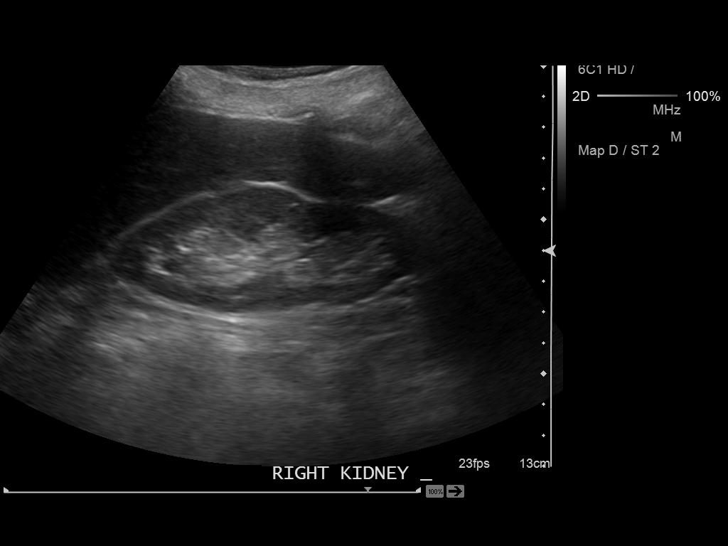
[im 4/43]
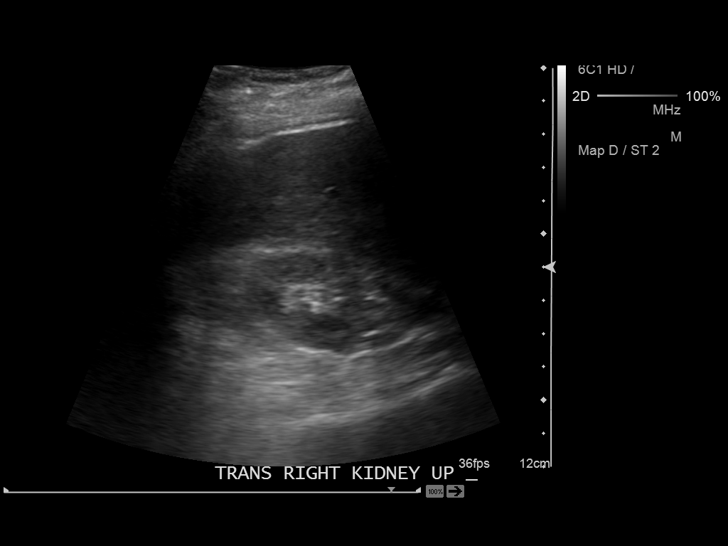
[im 8/43]
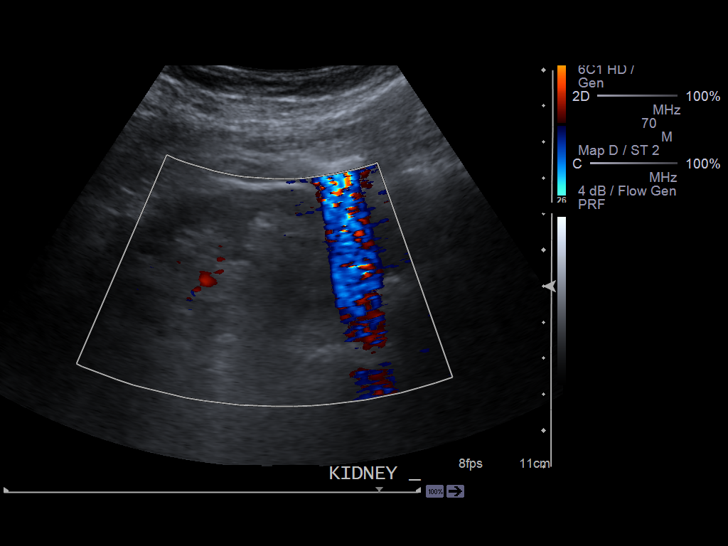
[im 11/43]
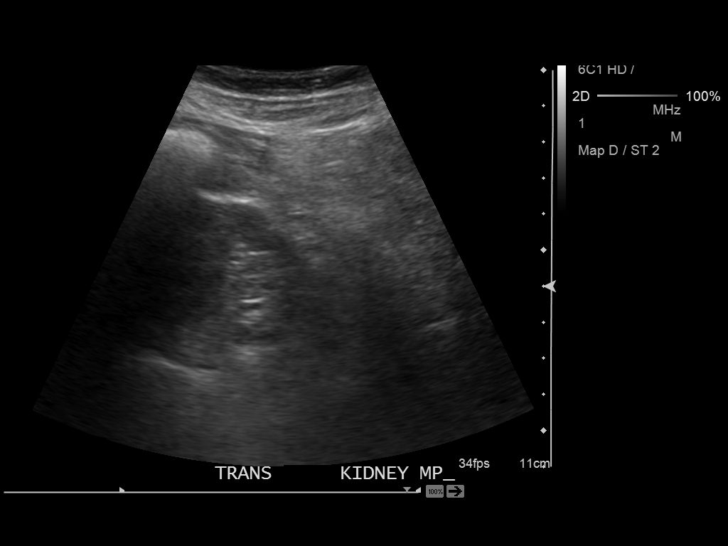
[im 15/43]
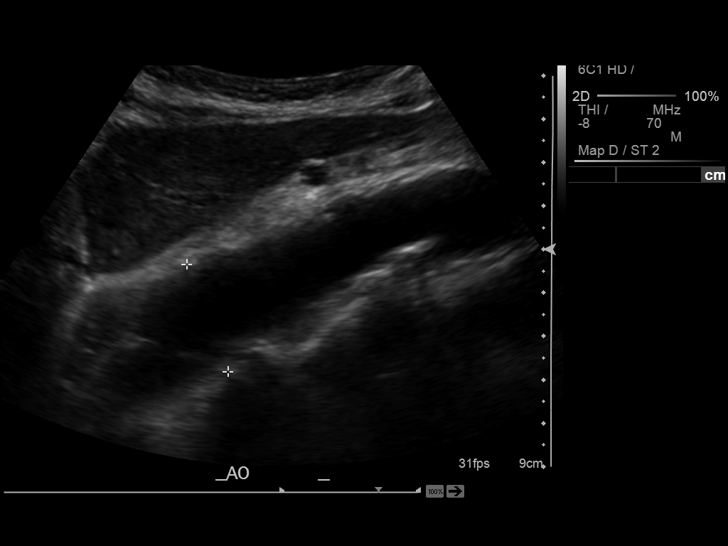
[im 16/43]
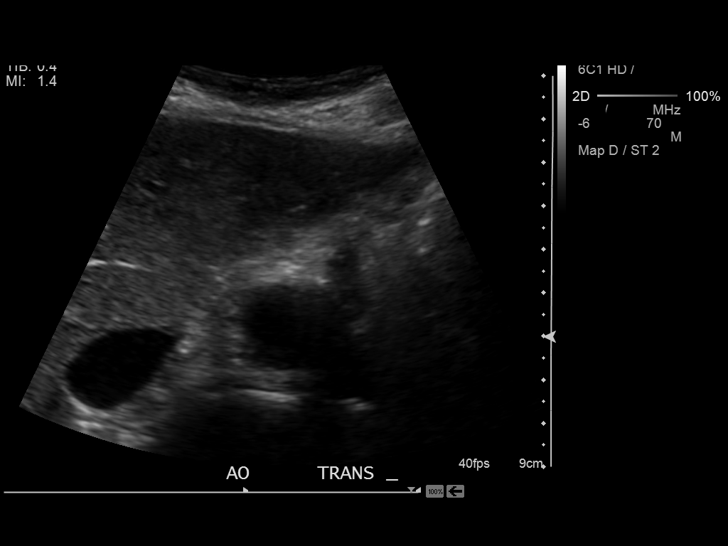
[im 20/43]
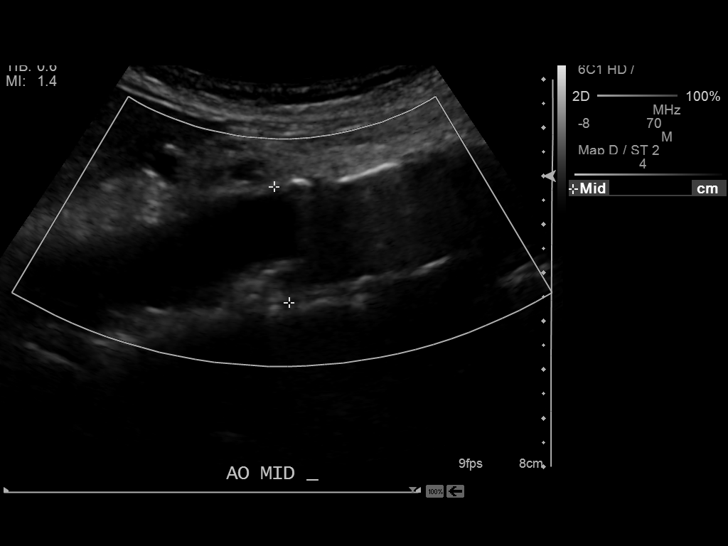
[im 23/43]
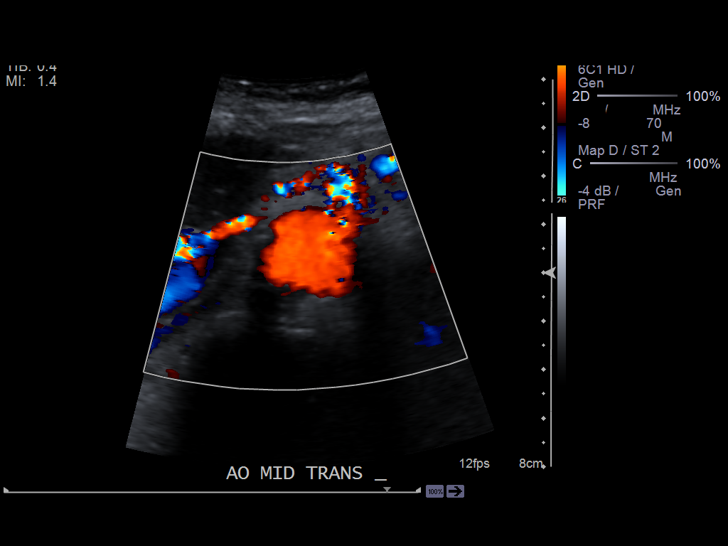
[im 27/43]
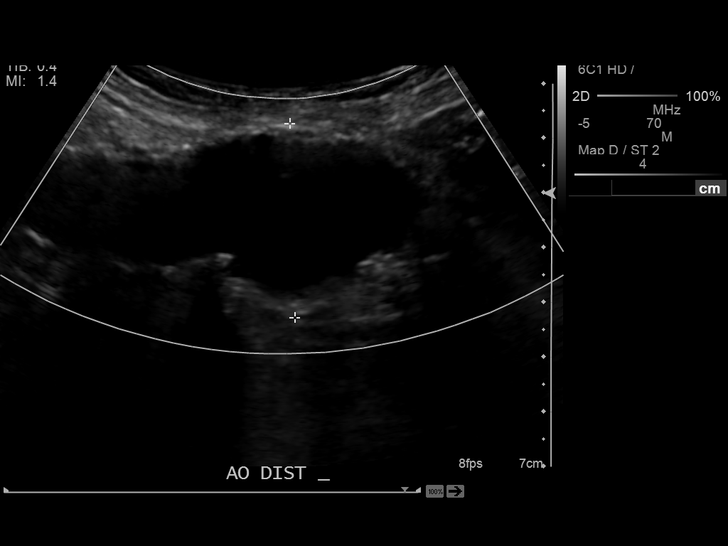
[im 29/43]
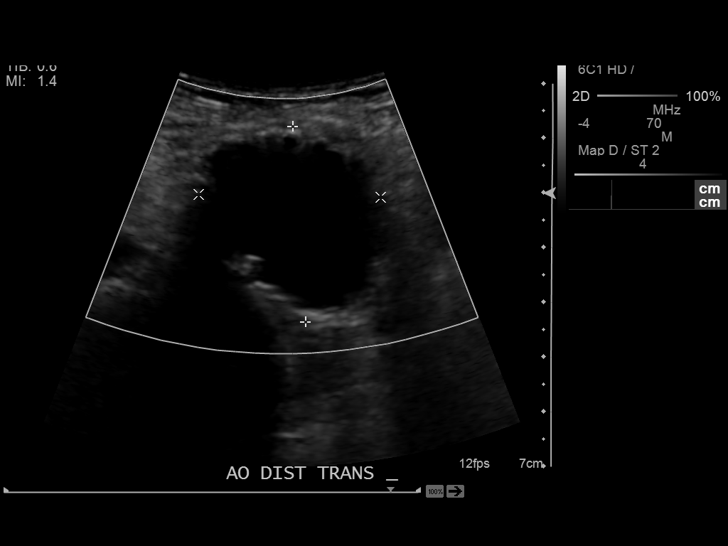
[im 32/43]
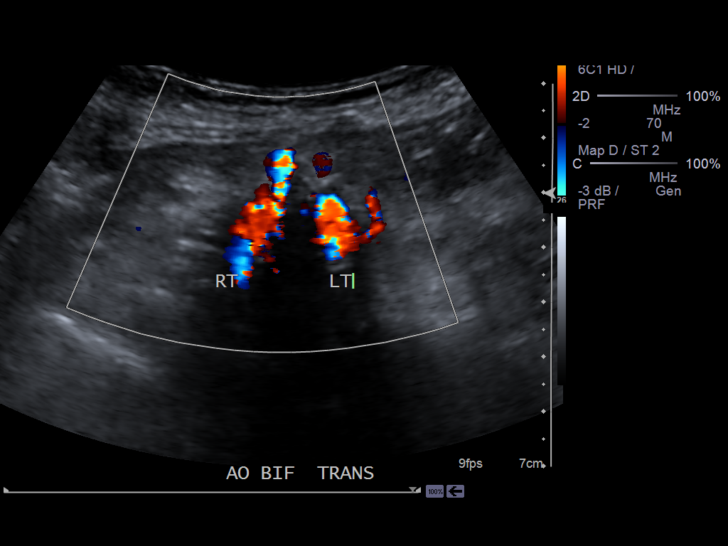
[im 36/43]
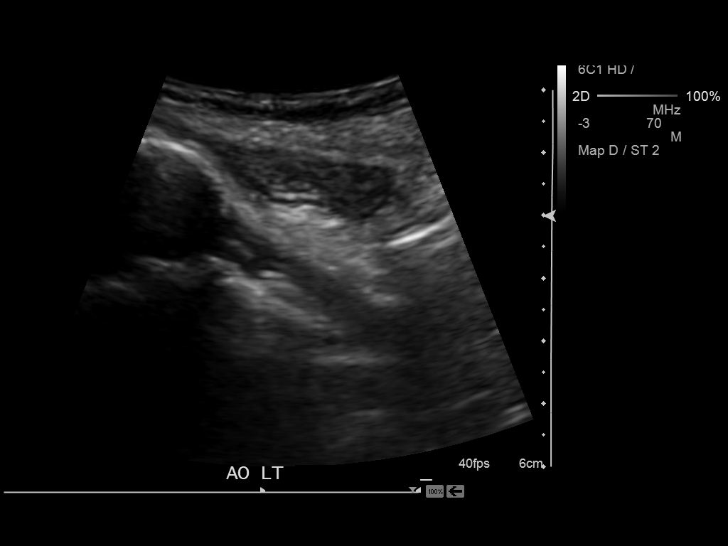
[im 39/43]
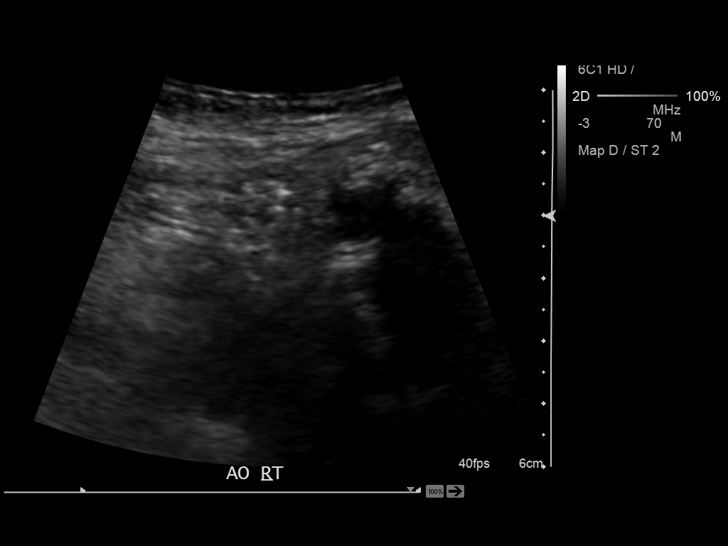
[im 43/43]
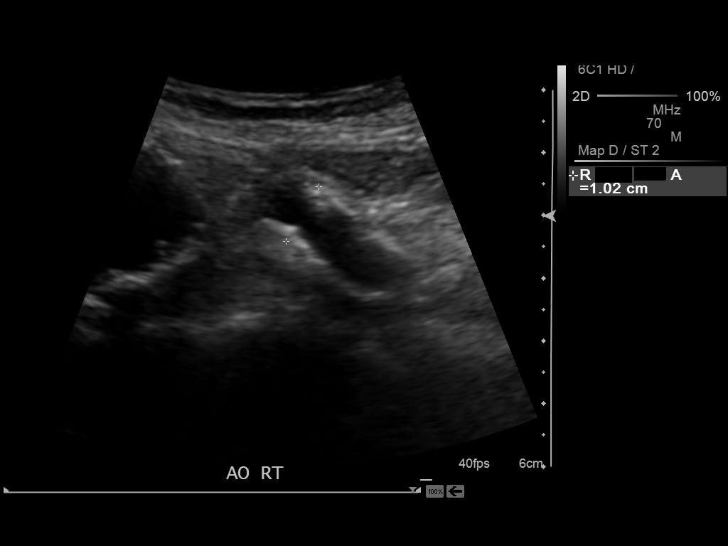

[14 of 25 positions shown; findings below may reference images not displayed]

FINDINGS: Abdominal Aorta

There is diffuse atherosclerotic calcified plaque in the abdominal
wall. Heterogeneous color Doppler flow in the distal aortic
aneurysm.

Maximum AP

Diameter:  3.6cm

Maximum TRV

Diameter: 3.3 cm

Right Common Iliac Artery

Extensive atherosclerosis. No aneurysm identified (Maximal diameter
13 mm).

Left Common Iliac Artery

Extensive atherosclerosis. No aneurysm identified (maximal diameter
12 mm).

IVC

No abnormality visualized.

Right Kidney

Length: 10 cm Echogenicity within normal limits. No mass or
hydronephrosis visualized.

Left Kidney

Length: 9 cm Echogenicity within normal limits. No mass or
hydronephrosis visualized.
IMPRESSION: 1. Infrarenal abdominal aortic aneurysm, size unchanged at 3.6 x
cm (maximal axial dimension).
2. No common iliac artery aneurysm.

## 2016-04-14 ENCOUNTER — Telehealth: Payer: Self-pay | Admitting: Internal Medicine

## 2016-04-14 NOTE — Telephone Encounter (Signed)
I called pt and left a vm to call and let us know if pt would like to continue care with one of the other providers. Thank you!
